# Patient Record
Sex: Female | Born: 1970 | Race: White | Hispanic: No | Marital: Married | Smoking: Former smoker
Health system: Southern US, Community
[De-identification: ages and names within clinical notes are randomized; demographics above are authoritative.]

## PROBLEM LIST (undated history)

## (undated) DIAGNOSIS — M542 Cervicalgia: Secondary | ICD-10-CM

## (undated) DIAGNOSIS — R51 Headache: Secondary | ICD-10-CM

## (undated) DIAGNOSIS — R87629 Unspecified abnormal cytological findings in specimens from vagina: Secondary | ICD-10-CM

## (undated) DIAGNOSIS — M5030 Other cervical disc degeneration, unspecified cervical region: Secondary | ICD-10-CM

## (undated) DIAGNOSIS — G8929 Other chronic pain: Secondary | ICD-10-CM

## (undated) DIAGNOSIS — M25511 Pain in right shoulder: Secondary | ICD-10-CM

## (undated) DIAGNOSIS — R519 Headache, unspecified: Secondary | ICD-10-CM

## (undated) HISTORY — DX: Unspecified abnormal cytological findings in specimens from vagina: R87.629

## (undated) HISTORY — PX: BACK SURGERY: SHX140

## (undated) HISTORY — PX: OTHER SURGICAL HISTORY: SHX169

## (undated) HISTORY — PX: GYNECOLOGIC CRYOSURGERY: SHX857

---

## 2002-01-06 ENCOUNTER — Encounter: Payer: Self-pay | Admitting: Internal Medicine

## 2002-01-06 ENCOUNTER — Emergency Department (HOSPITAL_COMMUNITY): Admission: EM | Admit: 2002-01-06 | Discharge: 2002-01-06 | Payer: Self-pay | Admitting: Internal Medicine

## 2003-02-02 ENCOUNTER — Emergency Department (HOSPITAL_COMMUNITY): Admission: EM | Admit: 2003-02-02 | Discharge: 2003-02-02 | Payer: Self-pay | Admitting: Emergency Medicine

## 2008-07-15 ENCOUNTER — Encounter: Payer: Self-pay | Admitting: Orthopedic Surgery

## 2008-07-30 ENCOUNTER — Encounter: Payer: Self-pay | Admitting: Orthopedic Surgery

## 2009-06-09 ENCOUNTER — Encounter: Payer: Self-pay | Admitting: Maternal & Fetal Medicine

## 2009-07-14 ENCOUNTER — Encounter: Payer: Self-pay | Admitting: Obstetrics and Gynecology

## 2010-09-24 ENCOUNTER — Encounter: Payer: Self-pay | Admitting: Orthopaedic Surgery

## 2010-09-29 ENCOUNTER — Encounter: Payer: Self-pay | Admitting: Orthopaedic Surgery

## 2011-01-26 ENCOUNTER — Other Ambulatory Visit (HOSPITAL_COMMUNITY): Payer: Self-pay | Admitting: Orthopaedic Surgery

## 2011-01-26 DIAGNOSIS — M542 Cervicalgia: Secondary | ICD-10-CM

## 2011-01-26 DIAGNOSIS — M25519 Pain in unspecified shoulder: Secondary | ICD-10-CM

## 2011-01-28 ENCOUNTER — Ambulatory Visit (HOSPITAL_COMMUNITY)
Admission: RE | Admit: 2011-01-28 | Discharge: 2011-01-28 | Disposition: A | Discharge status: Home / Self Care | Payer: Medicaid Other | Source: Ambulatory Visit | Attending: Orthopaedic Surgery | Admitting: Orthopaedic Surgery

## 2011-01-28 DIAGNOSIS — R209 Unspecified disturbances of skin sensation: Secondary | ICD-10-CM | POA: Insufficient documentation

## 2011-01-28 DIAGNOSIS — M5020 Other cervical disc displacement, unspecified cervical region: Secondary | ICD-10-CM | POA: Insufficient documentation

## 2011-01-28 DIAGNOSIS — M25519 Pain in unspecified shoulder: Secondary | ICD-10-CM | POA: Insufficient documentation

## 2011-01-28 DIAGNOSIS — M5380 Other specified dorsopathies, site unspecified: Secondary | ICD-10-CM | POA: Insufficient documentation

## 2011-01-28 DIAGNOSIS — M542 Cervicalgia: Secondary | ICD-10-CM | POA: Insufficient documentation

## 2012-02-17 ENCOUNTER — Encounter (HOSPITAL_COMMUNITY): Payer: Self-pay | Admitting: *Deleted

## 2012-02-17 ENCOUNTER — Emergency Department (HOSPITAL_COMMUNITY)
Admission: EM | Admit: 2012-02-17 | Discharge: 2012-02-17 | Disposition: A | Discharge status: Home / Self Care | Payer: Medicaid Other | Attending: Emergency Medicine | Admitting: Emergency Medicine

## 2012-02-17 DIAGNOSIS — J329 Chronic sinusitis, unspecified: Secondary | ICD-10-CM | POA: Insufficient documentation

## 2012-02-17 DIAGNOSIS — F17200 Nicotine dependence, unspecified, uncomplicated: Secondary | ICD-10-CM | POA: Insufficient documentation

## 2012-02-17 MED ORDER — AZITHROMYCIN 250 MG PO TABS: ORAL_TABLET | ORAL | Status: AC

## 2012-02-17 MED ORDER — HYDROCODONE-ACETAMINOPHEN 5-325 MG PO TABS: 2.0000 | ORAL_TABLET | ORAL | Status: AC | PRN

## 2012-02-17 MED ORDER — ACETAMINOPHEN 325 MG PO TABS
650.0000 mg | ORAL_TABLET | Freq: Once | ORAL | Status: AC
  Administered 2012-02-17: 650 mg via ORAL
  Filled 2012-02-17: qty 2

## 2012-02-17 MED ORDER — BENZONATATE 100 MG PO CAPS
200.0000 mg | ORAL_CAPSULE | Freq: Once | ORAL | Status: AC
  Administered 2012-02-17: 200 mg via ORAL
  Filled 2012-02-17: qty 2

## 2012-02-17 NOTE — ED Notes (Signed)
Pt c/o nasal congestion x 2 weeks. Pt also c/o coughing up green phlegm and headache x 2 days.

## 2012-02-17 NOTE — Discharge Instructions (Signed)
 Sinusitis Sinuses are air pockets within the bones of your face. The growth of bacteria within a sinus leads to infection. The infection prevents the sinuses from draining. This infection is called sinusitis. SYMPTOMS  There will be different areas of pain depending on which sinuses have become infected.  The maxillary sinuses often produce pain beneath the eyes.   Frontal sinusitis may cause pain in the middle of the forehead and above the eyes.  Other problems (symptoms) include:  Toothaches.   Colored, pus-like (purulent) drainage from the nose.   Swelling, warmth, and tenderness over the sinus areas may be signs of infection.  TREATMENT  Sinusitis is most often determined by an exam.X-rays may be taken. If x-rays have been taken, make sure you obtain your results or find out how you are to obtain them. Your caregiver may give you medications (antibiotics). These are medications that will help kill the bacteria causing the infection. You may also be given a medication (decongestant) that helps to reduce sinus swelling.  HOME CARE INSTRUCTIONS   Only take over-the-counter or prescription medicines for pain, discomfort, or fever as directed by your caregiver.   Drink extra fluids. Fluids help thin the mucus so your sinuses can drain more easily.   Applying either moist heat or ice packs to the sinus areas may help relieve discomfort.   Use saline nasal sprays to help moisten your sinuses. The sprays can be found at your local drugstore.  SEEK IMMEDIATE MEDICAL CARE IF:  You have a fever.   You have increasing pain, severe headaches, or toothache.   You have nausea, vomiting, or drowsiness.   You develop unusual swelling around the face or trouble seeing.  MAKE SURE YOU:   Understand these instructions.   Will watch your condition.   Will get help right away if you are not doing well or get worse.  Document Released: 11/15/2005 Document Revised: 11/04/2011 Document Reviewed:  06/14/2007 [facility] Patient Information 2012 [place], [place].

## 2012-02-17 NOTE — ED Notes (Signed)
Pt senn and evaluated by EDP for initial assessment.

## 2012-02-17 NOTE — ED Provider Notes (Signed)
History   This chart was scribed for Kelly Baker, MD scribed by Magnus Sinning. The patient was seen in room APA11/APA11 seen at 15:50.   CSN: [id number]  Arrival date & time 02/17/12  1522   First MD Initiated Contact with Patient 02/17/12 1547      Chief Complaint  Patient presents with  . Nasal Congestion    (Consider location/radiation/quality/duration/timing/severity/associated sxs/prior treatment) HPI Kelly Nicholson is a 41 y.o. female who presents to the Emergency Department complaining of constant moderate nasal and head congestion onset 2 weeks with associated runny nose, and cough onset two days. Denies fever,n/v/d, or urinary sx.  Reports taking tylenol cold and sinus, and benedryl w/o improvement. Pt is a current smoker, but denies any medical conditions or problems.  History reviewed. No pertinent past medical history.  Past Surgical History  Procedure Date  . Back surgery     History reviewed. No pertinent family history.  History  Substance Use Topics  . Smoking status: Current Everyday Smoker -- 0.5 packs/day  . Smokeless tobacco: Not on file  . Alcohol Use: No   Review of Systems  HENT: Positive for congestion and sinus pressure.   Respiratory: Positive for cough.   Gastrointestinal: Negative for nausea, vomiting and diarrhea.  Genitourinary: Negative.   All other systems reviewed and are negative.   10 Systems reviewed and are negative for acute change except as noted in the HPI. Allergies  Amoxicillin  Home Medications  No current outpatient prescriptions on file.  BP 96/41  Pulse 68  Temp(Src) 98 F (36.7 C) (Oral)  Resp 18  Ht 5\' 7"  (1.702 m)  Wt 160 lb (72.576 kg)  BMI 25.06 kg/m2  SpO2 100%  LMP 02/14/2012  Physical Exam  Nursing note and vitals reviewed. Constitutional: She is oriented to person, place, and time. She appears well-developed and well-nourished. No distress.  HENT:  Head: Normocephalic and atraumatic.  Right  Ear: External ear normal.  Left Ear: External ear normal.  Mouth/Throat: Oropharynx is clear and moist. No oropharyngeal exudate.       Maxillary sinus pain  Eyes: EOM are normal. Pupils are equal, round, and reactive to light.  Neck: Normal range of motion. Neck supple. No tracheal deviation present.  Cardiovascular: Normal rate.   Pulmonary/Chest: Effort normal and breath sounds normal. No respiratory distress. She has no wheezes. She has no rales.  Abdominal: Soft. She exhibits no distension.  Musculoskeletal: Normal range of motion. She exhibits no edema.  Lymphadenopathy:    She has no cervical adenopathy.  Neurological: She is alert and oriented to person, place, and time. No sensory deficit.  Skin: Skin is warm and dry.  Psychiatric: She has a normal mood and affect. Her behavior is normal.    ED Course  Procedures (including critical care time) DIAGNOSTIC STUDIES: Oxygen Saturation is 100% on room air, normal by my interpretation.    COORDINATION OF CARE:  Labs Reviewed - No data to display No results found.   No diagnosis found.    MDM  Pt to be tx for sinusitis I personally performed the services described in this documentation, which was scribed in my presence. The recorded information has been reviewed and considered.          Kelly Baker, MD 02/17/12 [phone]

## 2012-08-16 ENCOUNTER — Emergency Department (HOSPITAL_COMMUNITY)
Admission: EM | Admit: 2012-08-16 | Discharge: 2012-08-16 | Disposition: A | Discharge status: Home / Self Care | Payer: Medicaid Other | Attending: Emergency Medicine | Admitting: Emergency Medicine

## 2012-08-16 ENCOUNTER — Encounter (HOSPITAL_COMMUNITY): Payer: Self-pay | Admitting: Emergency Medicine

## 2012-08-16 DIAGNOSIS — F17200 Nicotine dependence, unspecified, uncomplicated: Secondary | ICD-10-CM | POA: Insufficient documentation

## 2012-08-16 DIAGNOSIS — Z880 Allergy status to penicillin: Secondary | ICD-10-CM | POA: Insufficient documentation

## 2012-08-16 DIAGNOSIS — R51 Headache: Secondary | ICD-10-CM | POA: Insufficient documentation

## 2012-08-16 MED ORDER — MORPHINE SULFATE 4 MG/ML IJ SOLN
8.0000 mg | Freq: Once | INTRAMUSCULAR | Status: AC
  Administered 2012-08-16: 8 mg via INTRAMUSCULAR
  Filled 2012-08-16: qty 2

## 2012-08-16 MED ORDER — PROMETHAZINE HCL 12.5 MG PO TABS
25.0000 mg | ORAL_TABLET | Freq: Once | ORAL | Status: AC
  Administered 2012-08-16: 25 mg via ORAL
  Filled 2012-08-16 (×2): qty 1

## 2012-08-16 MED ORDER — HYDROCODONE-ACETAMINOPHEN 7.5-325 MG PO TABS: 1.0000 | ORAL_TABLET | ORAL | Status: AC | PRN

## 2012-08-16 NOTE — ED Notes (Signed)
Pt presents with migraine headache x 3 days. Sound and light sensitivity has increased over past 2 days.  Pt states has headaches almost monthly. Denies n/v, fever and vision changes. Pt appears comfortable watching TV with light off. NAD noted.

## 2012-08-16 NOTE — ED Provider Notes (Signed)
History     CSN: [id number]  Arrival date & time 08/16/12  1028   First MD Initiated Contact with Patient 08/16/12 1357      Chief Complaint  Patient presents with  . Headache    (Consider location/radiation/quality/duration/timing/severity/associated sxs/prior treatment) Patient is a 41 y.o. female presenting with headaches. The history is provided by the patient.  Headache  This is a recurrent problem. The current episode started 2 days ago. The problem has been gradually worsening. Associated with: cervical disc disease and stress. The pain is located in the right unilateral region. The quality of the pain is described as throbbing. The pain is moderate. Associated symptoms include nausea. Pertinent negatives include no fever, no palpitations and no shortness of breath. She has tried acetaminophen for the symptoms. The treatment provided no relief.    History reviewed. No pertinent past medical history.  Past Surgical History  Procedure Date  . Back surgery     No family history on file.  History  Substance Use Topics  . Smoking status: Current Every Day Smoker -- 0.5 packs/day  . Smokeless tobacco: Not on file  . Alcohol Use: No    OB History    Grav Para Term Preterm Abortions TAB SAB Ect Mult Living                  Review of Systems  Constitutional: Negative for fever and activity change.       All ROS Neg except as noted in HPI  HENT: Negative for nosebleeds and neck pain.   Eyes: Negative for photophobia and discharge.  Respiratory: Negative for cough, shortness of breath and wheezing.   Cardiovascular: Negative for chest pain and palpitations.  Gastrointestinal: Positive for nausea. Negative for abdominal pain and blood in stool.  Genitourinary: Negative for dysuria, frequency and hematuria.  Musculoskeletal: Positive for back pain. Negative for arthralgias.  Skin: Negative.   Neurological: Positive for headaches. Negative for dizziness, seizures and  speech difficulty.  Psychiatric/Behavioral: Negative for hallucinations and confusion.    Allergies  Amoxicillin  Home Medications   Current Outpatient Rx  Name Route Sig Dispense Refill  . ACETAMINOPHEN 500 MG PO TABS Oral Take 1,000 mg by mouth every 8 (eight) hours as needed. Pain    . IBUPROFEN 800 MG PO TABS Oral Take 800 mg by mouth every 8 (eight) hours as needed. Pain      BP 111/63  Pulse 60  Temp 98.4 F (36.9 C) (Oral)  Resp 18  Ht 5\' 7"  (1.702 m)  Wt 160 lb (72.576 kg)  BMI 25.06 kg/m2  SpO2 100%  LMP 08/10/2012  Physical Exam  Nursing note and vitals reviewed. Constitutional: She is oriented to person, place, and time. She appears well-developed and well-nourished.  Non-toxic appearance.  HENT:  Head: Normocephalic.  Right Ear: Tympanic membrane and external ear normal.  Left Ear: Tympanic membrane and external ear normal.  Eyes: EOM and lids are normal. Pupils are equal, round, and reactive to light.  Neck: Normal range of motion. Neck supple. Carotid bruit is not present.  Cardiovascular: Normal rate, regular rhythm, normal heart sounds, intact distal pulses and normal pulses.   Pulmonary/Chest: Breath sounds normal. No respiratory distress.  Abdominal: Soft. Bowel sounds are normal. There is no tenderness. There is no guarding.  Musculoskeletal: Normal range of motion.  Lymphadenopathy:       Head (right side): No submandibular adenopathy present.       Head (left side): No submandibular  adenopathy present.    She has no cervical adenopathy.  Neurological: She is alert and oriented to person, place, and time. She has normal strength. No cranial nerve deficit or sensory deficit. She exhibits normal muscle tone. Coordination normal.  Skin: Skin is warm and dry.  Psychiatric: She has a normal mood and affect. Her speech is normal.    ED Course  Procedures (including critical care time)  Labs Reviewed - No data to display No results found.  PUlse ox  100% on room air. WNL by my interpretation. No diagnosis found.    MDM    patient has history of headaches. Mostly the headache seemed to be related to a cervical disc problem. The patient had previously been seen by neurosurgeon, but is no longer seeing the neurosurgeon at this time. The patient is treated with Norco 7.5 mg one every 4 hours as needed for.       Kathie Dike, [place] 08/16/12 [phone]

## 2012-08-16 NOTE — ED Provider Notes (Signed)
Medical screening examination/treatment/procedure(s) were performed by non-physician practitioner and as supervising physician I was immediately available for consultation/collaboration.   Garnie Borchardt L Curlie Sittner, MD 08/16/12 1540 

## 2012-08-16 NOTE — ED Notes (Signed)
Patient with c/o headache x 2 days. Denies nausea/vomiting. Reports no relief with heat, ice, motrin/tylenol.

## 2014-01-18 ENCOUNTER — Encounter (HOSPITAL_COMMUNITY): Payer: Self-pay | Admitting: Emergency Medicine

## 2014-01-18 ENCOUNTER — Emergency Department (HOSPITAL_COMMUNITY): Payer: Medicaid Other

## 2014-01-18 ENCOUNTER — Emergency Department (HOSPITAL_COMMUNITY)
Admission: EM | Admit: 2014-01-18 | Discharge: 2014-01-18 | Disposition: A | Discharge status: Home / Self Care | Payer: Medicaid Other | Attending: Emergency Medicine | Admitting: Emergency Medicine

## 2014-01-18 DIAGNOSIS — R519 Headache, unspecified: Secondary | ICD-10-CM

## 2014-01-18 DIAGNOSIS — Z8739 Personal history of other diseases of the musculoskeletal system and connective tissue: Secondary | ICD-10-CM | POA: Insufficient documentation

## 2014-01-18 DIAGNOSIS — Z880 Allergy status to penicillin: Secondary | ICD-10-CM | POA: Insufficient documentation

## 2014-01-18 DIAGNOSIS — J159 Unspecified bacterial pneumonia: Secondary | ICD-10-CM | POA: Insufficient documentation

## 2014-01-18 DIAGNOSIS — R51 Headache: Secondary | ICD-10-CM | POA: Insufficient documentation

## 2014-01-18 DIAGNOSIS — J189 Pneumonia, unspecified organism: Secondary | ICD-10-CM

## 2014-01-18 DIAGNOSIS — F17200 Nicotine dependence, unspecified, uncomplicated: Secondary | ICD-10-CM | POA: Insufficient documentation

## 2014-01-18 DIAGNOSIS — G8929 Other chronic pain: Secondary | ICD-10-CM | POA: Insufficient documentation

## 2014-01-18 DIAGNOSIS — Z3202 Encounter for pregnancy test, result negative: Secondary | ICD-10-CM | POA: Insufficient documentation

## 2014-01-18 HISTORY — DX: Headache, unspecified: R51.9

## 2014-01-18 HISTORY — DX: Other chronic pain: G89.29

## 2014-01-18 HISTORY — DX: Headache: R51

## 2014-01-18 HISTORY — DX: Cervicalgia: M54.2

## 2014-01-18 HISTORY — DX: Other cervical disc degeneration, unspecified cervical region: M50.30

## 2014-01-18 HISTORY — DX: Pain in right shoulder: M25.511

## 2014-01-18 LAB — URINE MICROSCOPIC-ADD ON

## 2014-01-18 LAB — URINALYSIS, ROUTINE W REFLEX MICROSCOPIC
Bilirubin Urine: NEGATIVE
Glucose, UA: NEGATIVE mg/dL
Ketones, ur: NEGATIVE mg/dL
LEUKOCYTES UA: NEGATIVE
NITRITE: NEGATIVE
PH: 6 (ref 5.0–8.0)
Protein, ur: NEGATIVE mg/dL
UROBILINOGEN UA: 0.2 mg/dL (ref 0.0–1.0)

## 2014-01-18 LAB — PREGNANCY, URINE: PREG TEST UR: NEGATIVE

## 2014-01-18 LAB — I-STAT CHEM 8, ED
BUN: 8 mg/dL (ref 6–23)
CALCIUM ION: 1.04 mmol/L — AB (ref 1.12–1.23)
CHLORIDE: 107 meq/L (ref 96–112)
Creatinine, Ser: 0.7 mg/dL (ref 0.50–1.10)
Glucose, Bld: 81 mg/dL (ref 70–99)
HEMATOCRIT: 41 % (ref 36.0–46.0)
Hemoglobin: 13.9 g/dL (ref 12.0–15.0)
POTASSIUM: 4.4 meq/L (ref 3.7–5.3)
SODIUM: 138 meq/L (ref 137–147)
TCO2: 23 mmol/L (ref 0–100)

## 2014-01-18 MED ORDER — SODIUM CHLORIDE 0.9 % IV SOLN
INTRAVENOUS | Status: DC
  Administered 2014-01-18: 100 mL/h via INTRAVENOUS

## 2014-01-18 MED ORDER — KETOROLAC TROMETHAMINE 60 MG/2ML IM SOLN
60.0000 mg | Freq: Once | INTRAMUSCULAR | Status: AC
  Administered 2014-01-18: 60 mg via INTRAMUSCULAR
  Filled 2014-01-18: qty 2

## 2014-01-18 MED ORDER — DIPHENHYDRAMINE HCL 50 MG/ML IJ SOLN
50.0000 mg | Freq: Once | INTRAMUSCULAR | Status: AC
  Administered 2014-01-18: 50 mg via INTRAMUSCULAR
  Filled 2014-01-18: qty 1

## 2014-01-18 MED ORDER — IOHEXOL 350 MG/ML SOLN
100.0000 mL | Freq: Once | INTRAVENOUS | Status: AC | PRN
  Administered 2014-01-18: 100 mL via INTRAVENOUS

## 2014-01-18 MED ORDER — PROMETHAZINE HCL 25 MG/ML IJ SOLN
25.0000 mg | Freq: Once | INTRAMUSCULAR | Status: AC
  Administered 2014-01-18: 25 mg via INTRAMUSCULAR
  Filled 2014-01-18: qty 1

## 2014-01-18 MED ORDER — PROMETHAZINE HCL 25 MG PO TABS: 25.0000 mg | ORAL_TABLET | Freq: Four times a day (QID) | ORAL | Status: DC | PRN

## 2014-01-18 MED ORDER — AZITHROMYCIN 250 MG PO TABS: 250.0000 mg | ORAL_TABLET | Freq: Every day | ORAL | Status: DC

## 2014-01-18 MED ORDER — AZITHROMYCIN 250 MG PO TABS
500.0000 mg | ORAL_TABLET | Freq: Once | ORAL | Status: AC
  Administered 2014-01-18: 500 mg via ORAL
  Filled 2014-01-18: qty 2

## 2014-01-18 NOTE — ED Provider Notes (Signed)
CSN: [id number]     Arrival date & time 01/18/14  1248 History   First MD Initiated Contact with Patient 01/18/14 1340     Chief Complaint  Patient presents with  . Headache     HPI Pt was seen at 1350. Per pt, c/o gradual onset and persistence of constant acute flair of her chronic migraine headache for the past 2 days.  Describes the headache as per her usual chronic migraine headache pain pattern for many years. Endorses chronic recurrent monthly headaches "around when I get my period."  Denies headache was sudden or maximal in onset or at any time.  Denies visual changes, no focal motor weakness, no tingling/numbness in extremities, no fevers, no neck pain, no rash.  Pt also c/o gradual onset and persistence of constant right proximal posterior-lateral ribs "pain" for the past 5 days. Pt states the pain began after she did "a lot of pulling of heavy boxes" at work. Pain worsens with palpation of the area and "pulling" movement of her RUE. Denies direct injury, no SOB/cough, no rash.     Past Medical History  Diagnosis Date  . Chronic headaches   . Chronic neck pain     extending into right shoulder  . DDD (degenerative disc disease), cervical   . Chronic right shoulder pain    Past Surgical History  Procedure Laterality Date  . Back surgery      History  Substance Use Topics  . Smoking status: Current Every Day Smoker -- 0.50 packs/day  . Smokeless tobacco: Not on file  . Alcohol Use: No    Review of Systems ROS: Statement: All systems negative except as marked or noted in the HPI; Constitutional: Negative for fever and chills. ; ; Eyes: Negative for eye pain, redness and discharge. ; ; ENMT: Negative for ear pain, hoarseness, nasal congestion, sinus pressure and sore throat. ; ; Cardiovascular: Negative for chest pain, palpitations, diaphoresis, dyspnea and peripheral edema. ; ; Respiratory: Negative for cough, wheezing and stridor. ; ; Gastrointestinal: Negative for nausea,  vomiting, diarrhea, abdominal pain, blood in stool, hematemesis, jaundice and rectal bleeding. . ; ; Genitourinary: Negative for dysuria, flank pain and hematuria. ; ; Musculoskeletal: +right proximal posterior-lateral ribs pain. Negative for back pain and neck pain. Negative for swelling and trauma.; ; Skin: Negative for pruritus, rash, abrasions, blisters, bruising and skin lesion.; ; Neuro: +headache. Negative for lightheadedness and neck stiffness. Negative for weakness, altered level of consciousness , altered mental status, extremity weakness, paresthesias, involuntary movement, seizure and syncope.      Allergies  Amoxicillin and Tramadol  Home Medications   Current Outpatient Rx  Name  Route  Sig  Dispense  Refill  . acetaminophen (TYLENOL) 500 MG tablet   Oral   Take 1,000 mg by mouth every 8 (eight) hours as needed. Pain         . ibuprofen (ADVIL,MOTRIN) 800 MG tablet   Oral   Take 800 mg by mouth every 8 (eight) hours as needed. Pain          BP 117/59  Pulse 82  Temp(Src) 98.6 F (37 C) (Oral)  Resp 19  Ht 5\' 7"  (1.702 m)  Wt 161 lb (73.029 kg)  BMI 25.21 kg/m2  SpO2 99%  LMP 01/18/2014 Physical Exam 1355: Physical examination:  Nursing notes reviewed; Vital signs and O2 SAT reviewed;  Constitutional: Well developed, Well nourished, Well hydrated, In no acute distress; Head:  Normocephalic, atraumatic; Eyes: EOMI, PERRL, No scleral  icterus; ENMT: TM's clear bilat. +edemetous nasal turbinates bilat with clear rhinorrhea. Mouth and pharynx normal, Mucous membranes moist; Neck: Supple, Full range of motion, No lymphadenopathy; Cardiovascular: Regular rate and rhythm, No murmur, rub, or gallop; Respiratory: Breath sounds clear & equal bilaterally, No rales, rhonchi, wheezes.  Speaking full sentences with ease, Normal respiratory effort/excursion; Chest: Nontender, Movement normal; Abdomen: Soft, Nontender, Nondistended, Normal bowel sounds; Genitourinary: No CVA  tenderness; Spine:  No midline CS, TS, LS tenderness. +TTP right latissimus dorsi, esp with RUE "pulling movement." No rash, no soft tissue crepitus, no deformity.;; Extremities: Pulses normal, No tenderness, No edema, No calf edema or asymmetry.; Neuro: AA&Ox3, Major CN grossly intact. No facial droop. Speech clear. Grips equal, strength 5/5 equal bilat UE's and LE's. Climbs on and off stretcher easily by herself. Gait steady. No gross focal motor or sensory deficits in extremities.; Skin: Color normal, Warm, Dry.   ED Course  Procedures     EKG Interpretation   None       MDM  MDM Reviewed: previous chart, nursing note and vitals   Results for orders placed during the hospital encounter of 01/18/14  PREGNANCY, URINE      Result Value Ref Range   Preg Test, Ur NEGATIVE  NEGATIVE  URINALYSIS, ROUTINE W REFLEX MICROSCOPIC      Result Value Ref Range   Color, Urine YELLOW  YELLOW   APPearance CLEAR  CLEAR   Specific Gravity, Urine <1.005 (*) 1.005 - 1.030   pH 6.0  5.0 - 8.0   Glucose, UA NEGATIVE  NEGATIVE mg/dL   Hgb urine dipstick LARGE (*) NEGATIVE   Bilirubin Urine NEGATIVE  NEGATIVE   Ketones, ur NEGATIVE  NEGATIVE mg/dL   Protein, ur NEGATIVE  NEGATIVE mg/dL   Urobilinogen, UA 0.2  0.0 - 1.0 mg/dL   Nitrite NEGATIVE  NEGATIVE   Leukocytes, UA NEGATIVE  NEGATIVE  URINE MICROSCOPIC-ADD ON      Result Value Ref Range   Squamous Epithelial / LPF FEW (*) RARE   WBC, UA 0-2  <3 WBC/hpf   RBC / HPF 3-6  <3 RBC/hpf   Bacteria, UA RARE  RARE  I-STAT CHEM 8, ED      Result Value Ref Range   Sodium 138  137 - 147 mEq/L   Potassium 4.4  3.7 - 5.3 mEq/L   Chloride 107  96 - 112 mEq/L   BUN 8  6 - 23 mg/dL   Creatinine, Ser 1.61  0.50 - 1.10 mg/dL   Glucose, Bld 81  70 - 99 mg/dL   Calcium, Ion 0.96 (*) 1.12 - 1.23 mmol/L   TCO2 23  0 - 100 mmol/L   Hemoglobin 13.9  12.0 - 15.0 g/dL   HCT 04.5  40.9 - 81.1 %   Dg Ribs Unilateral W/chest Right 01/18/2014   CLINICAL DATA:   Right-sided chest pain  EXAM: RIGHT RIBS AND CHEST - 3+ VIEW  COMPARISON:  None.  FINDINGS: Cardiac shadow is within normal limits. The lungs are well aerated bilaterally. Ribs are well visualized without evidence of acute rib fracture. No pneumothorax is noted. There is a density identified in the lateral pleural margin between the anterior aspects of the second and third ribs on the right. It is not as well appreciated on the oblique imaging.  IMPRESSION: No acute rib fracture is noted. There is a soft tissue density noted along the lateral pleural margin as described. Further evaluation is recommended.   Electronically Signed  By: Alcide Clever M.D.   On: 01/18/2014 14:41   Ct Angio Chest Pe W/cm &/or Wo Cm 01/18/2014   CLINICAL DATA:  Right chest and back pain  EXAM: CT CHEST WITH CONTRAST  TECHNIQUE: Multidetector CT imaging of the chest was performed during intravenous contrast administration.  CONTRAST:  OMNIPAQUE IOHEXOL 350 MG/ML SOLN  COMPARISON:  01/18/2014  FINDINGS: Pulmonary arteries appear patent and well visualized. No significant filling defect or pulmonary embolus demonstrated by CTA. Intact thoracic aorta. Negative for aneurysm or dissection. No mediastinal hemorrhage or hematoma. Normal heart size. No pericardial effusion. Mildly prominent right hilar lymph node measures 15 x 13 mm, image 37.  Small dependent non loculated right pleural effusion noted.  Lung windows demonstrate a peripheral subpleural right upper lobe focal airspace opacity, image 37 of the lung windows suspicious for mild right upper lobe focal pneumonia. Additionally, patchy ground-glass opacities noted in both lower lobes, more pronounced in the right lower lobe could represent a nonspecific mild alveolitis as well. Lingula, inferior right middle lobe, and basilar atelectasis also evident.  Lung apices demonstrate scattered subpleural blebs. No pneumothorax. Small peripheral noncalcified but well-circumscribed  pulmonary nodules are noted in the right lung. Specifically, there is a right lower lobe peripheral 5 mm pulmonary nodule, image 51 there is an inferior subpleural 4 mm nodule in the right middle lobe anteriorly, image 59. there is a second right lower lobe pulmonary nodule in the superior segment measuring 5 mm, image 41. Left lung does not demonstrate any significant pulmonary nodules.  Included upper abdomen demonstrates no acute or additional finding.  No acute osseous finding.  IMPRESSION: Negative for significant acute pulmonary embolus by CTA.  Small round peripheral right upper lobe airspace opacity correlates with the chest x-ray finding compatible with a focal pneumonia. Small associated non loculated right pleural effusion.  Bilateral lower lobe patchy ground-glass opacity compatible with nonspecific alveolitis  Bibasilar atelectasis  Subcentimeter right upper lobe and right lower lobe pulmonary nodules (3).  If the patient is at high risk for bronchogenic carcinoma, follow-up chest CT at 6-12 months is recommended. If the patient is at low risk for bronchogenic carcinoma, follow-up chest CT at 12 months is recommended. This recommendation follows the consensus statement: Guidelines for Management of Small Pulmonary Nodules Detected on CT Scans: A Statement from the Fleischner Society as published in Radiology 2005;237:395-400.   Electronically Signed   By: Ruel Favors M.D.   On: 01/18/2014 19:15    1950:  Will tx for CAP. Pt has been ambulatory with steady/upright gait, easy resps, NAD. Sats remain 99% R/A. Pt feels improved after meds and wants to go home now.  Dx and testing d/w pt and family.  Questions answered.  Verb understanding, agreeable to d/c home with outpt f/u.    Laray Anger, DO 01/22/14 1233

## 2014-01-18 NOTE — ED Notes (Signed)
Pt continues to ask for food and drink, advise pt and family pending test results

## 2014-01-18 NOTE — Discharge Instructions (Signed)
°Emergency Department Resource Guide °1) Find a Doctor and Pay Out of Pocket °Although you won't have to find out who is covered by your insurance plan, it is a good idea to ask around and get recommendations. You will then need to call the office and see if the doctor you have chosen will accept you as a new patient and what types of options they offer for patients who are self-pay. Some doctors offer discounts or will set up payment plans for their patients who do not have insurance, but you will need to ask so you aren't surprised when you get to your appointment. ° °2) Contact Your Local Health Department °Not all health departments have doctors that can see patients for sick visits, but many do, so it is worth a call to see if yours does. If you don't know where your local health department is, you can check in your phone book. The CDC also has a tool to help you locate your state's health department, and many state websites also have listings of all of their local health departments. ° °3) Find a Walk-in Clinic °If your illness is not likely to be very severe or complicated, you may want to try a walk in clinic. These are popping up all over the country in pharmacies, drugstores, and shopping centers. They're usually staffed by nurse practitioners or physician assistants that have been trained to treat common illnesses and complaints. They're usually fairly quick and inexpensive. However, if you have serious medical issues or chronic medical problems, these are probably not your best option. ° °No Primary Care Doctor: °- Call [facility] Connect at  832-8000 - they can help you locate a primary care doctor that  accepts your insurance, provides certain services, etc. °- Physician Referral Service- 1-800-533-3463 ° °Chronic Pain Problems: °Organization         Address  Phone   Notes  °[facility]  ([phone]) [phone] Patients need to be referred by their primary care doctor.  ° °Medication  Assistance: °Organization         Address  Phone   Notes  °[place] [facility] [address] °[place], [place] [address] (336) 641-8030 --Must be a resident of [place] °-- Must have NO insurance coverage whatsoever (no Medicaid/ Medicare, etc.) °-- The pt. MUST have a primary care doctor that directs their care regularly and follows them in the community °  °[facility]  (866) 331-1348   °[employer]  (888) 892-1162   ° °Agencies that provide inexpensive medical care: °Organization         Address  Phone   Notes  °[facility]  (336) 832-8035   ° Internal Medicine    (336) 832-7272   °[facility] [address] °[place], [place] [address] (336) 832-4777   °[facility] [address], °[place] (336) 271-4999   °[facility]    (336) 373-0678   °[facility]    (336) 272-1050   °[facility] ° [address], [place] Phone:  (336) 832-4444, Fax:  (336) 832-4440 Hours of Operation:  9 am - 6 pm, M-F.  Also accepts Medicaid/Medicare and self-pay.  °[facility] ° [address],  Phone: (336) 832-3150, Fax: (336) [phone]. Hours of Operation:  8:30 am - 5:30 pm, M-F.  Also accepts Medicaid and self-pay.  °[facility] [place] [address], [place] Phone: (336) 878-6027   °[facility] [address], [place], [place] (336)723-1848, Ext. 123 Mondays & Thursdays: 7-9 AM.  First 15 patients are seen on a first come, first serve basis. °  ° °Medicaid-accepting [place] Providers: ° °Organization         Address  Phone   Notes  °[facility] [address], [place] (336) 641-2100 Also accepts self-pay patients.  °[facility] [address], [place] ° (336) 856-9996   °[facility] [address], [place]  (336) 288-8857   °[facility] [address] [place] [address], [place] (336) 299-7000   °Veita Bland [address], [place]  ° (336) 373-1557 Only accepts [place] Access Medicaid patients after they have their name applied to their card.  ° °Self-Pay (no insurance) in [place]: ° °Organization         Address  Phone   Notes  °Sickle Cell Patients, [facility] [address], [place] (336) 832-1970   °[facility] [address], [place] (336) 832-4400   °[facility] ° [address], [place] (336) 992-4800   °[facility]/Dr. Osei-Bonsu ° [address] [place] [address], [place] or [address], [place] (336) [phone] Phone number for both [place] and [place] locations is the same.  °[facility] [address], [place] (336) 299-0000   °[facility] [place] [address] [place] [address], [place] or [address] (336) 852-7530 °(336) 878-2260   °[facility] [address], [place] (336) 350-1642, phone; (336) 294-5005, fax Sees patients 1st and 3rd Saturday of every month.  Must not qualify for public or private insurance (i.e. Medicaid, Medicare, [place] Health Choice, Veterans' Benefits) • Household income should be no more than 200% of the poverty level •The clinic cannot treat you if you are pregnant or think you are pregnant • Sexually transmitted diseases are not treated at the clinic.  ° ° °Dental Care: °Organization         Address  Phone  Notes  °[place] [facility] [address], [place] (336) 641-6152 Accepts children up to age 21 who are enrolled in Medicaid or [place] Health Choice; pregnant women with a Medicaid card; and children who have applied for Medicaid or [place] Health Choice, but were declined, whose parents can pay a reduced fee at time of service.  °[place]  [facility] [place]  [address], [place] (336) 641-7733 Accepts children up to age 21 who are enrolled in Medicaid or [place] Health Choice; pregnant women with a Medicaid card; and children who have applied for Medicaid or  Health Choice, but were declined, whose parents can pay a reduced fee at time of service.  °[facility] ° [address],  (336) 641-4533 Patients are seen by appointment only. Walk-ins are not accepted. [facility] will see patients 18 years of age and older. °Monday - Tuesday (8am-5pm) °Most Wednesdays (8:30-5pm) °$30 per visit, cash only  °[facility] ° [address], [place] (336) 641-4533 Patients are seen by appointment only. Walk-ins are not accepted. [facility] will see patients 18 years of age and older. °One   Wednesday Evening (Monthly: Volunteer Based).  $30 per visit, cash only  °[facility]  (919) [phone] for adults; Children under age 4, call Graduate Pediatric Dentistry at ([phone]) [phone]. Children aged 4-14, please call ([phone]) [phone] to request a pediatric application. ° Dental services are provided in all areas of dental care including fillings, crowns and bridges, complete and partial dentures, implants, gum treatment, root canals, and extractions. Preventive care is also provided. Treatment is provided to both adults and children. °Patients are selected via a lottery and there is often a waiting list. °  °[facility] [address], °[place] ° (336) 763-8833 [email or web address] °  °[facility] [address], [place], [place] (336)723-1848, Ext. 123 Second and Fourth Thursday of each month, opens at 6:30 AM; Clinic ends at 9 AM.  Patients are seen on a first-come first-served basis, and a limited number are seen during each clinic.  ° °[facility] ° [address], [place], [place] (336) 723-7904    Eligibility Requirements °You must have lived in [place], [place], or [place] counties for at least the last three months. °  You cannot be eligible for state or federal sponsored healthcare insurance, including Veterans Administration, Medicaid, or Medicare. °  You generally cannot be eligible for healthcare insurance through your employer.  °  How to apply: °Eligibility screenings are held every Tuesday and Wednesday afternoon from 1:00 pm until 4:00 pm. You do not need an appointment for the interview!  °[facility] [address], [place], [place] 336-631-2330   °[facility]  336-342-8273   °[facility]  336-703-3100   °[facility]  336-570-6415   ° °Behavioral Health Resources in the Community: °Intensive Outpatient Programs °Organization         Address  Phone  Notes  °[facility] [address], [place], [place] 336-878-6098   °[facility] [address], [place], [place] 336-832-9800   °[facility] [address], [place], [place] ° 336-882-2125   °[facility] [address],  °[place], [place] 1-800-853-5163 or 336-641-4981   °Substance Abuse Resources °Organization         Address  Phone  Notes  °[facility]  336-882-2125   °[facility]  336-784-9470   °[facility]  336-285-9073   °[facility]  336-845-3988   °Residential & Outpatient Substance Abuse Program  1-800-659-3381   °Psychological Services °Organization         Address  Phone  Notes  °[facility]  336- 832-9600   °[facility]  336- 378-7881   °[facility] [address], [place] 1-800-853-5163 or 336-641-4981   ° °Mobile Crisis Teams °Organization         Address  Phone  Notes  °[facility], Mobile Crisis Care Unit  1-877-626-1772   °Assertive °[facility] ° [address]  [place], [place] 336-834-9664   °Sharon DeEsch [address] °[place] 336-554-5454   ° °Self-Help/Support Groups °Organization         Address  Phone             Notes  °[facility]  - variety of support groups  336- 373-1402 Call for more information  °Narcotics Anonymous (NA), [facility] [address], °[place]   2 meetings at this location  ° °  Residential Treatment Programs Organization         Address  Phone  Notes  [facility] [address],    [place]  [phone]   [facility]  [address], [place] [phone], [place], [place] [phone]   [facility] [address], [place] [phone] Admissions: 8am-3pm M-F  [facility] [address]    [place], [place] [phone]) [phone]   [facility] [address], [place], [place] [phone]   [facility] [address]  [place], [place] [phone]   [facility] Intensive Outpatient [address], [place], [place] [phone]   [facility] [address]  [place], [place] [phone] or ([phone]   [facility] ([facility] [address] [place], [place] [phone] Accepts Medicaid  [facility] [address]  [place] [phone] Substance Abuse/Addiction Treatment   [facility]         Address  Phone  Notes  [facility]  (330)054-2658(888) [phone]   Angie FavaJulie Brannon, PhD [address] [place], [place]   [phone](336) [phone] or [phone](336) [phone]   [facility]   [address] [place], [place] [phone](336) ([phone]) [phone]   [facility] [address], [place], [place] [phone](336) [phone] Insurance/Medicaid/sponsorship through [facility] [address]                                    [place], [place] [phone]) [phone] Therapy/tele-psych/case    [facility] [address]   [place], [place] [phone]) [phone]    Dr. Lolly MustacheArfeen  [phone]) [phone]   [facility]  [facility] 1) [address], [place] 2) [address], [place] 3)  [address], [place] [phone]) [phone]  [phone]) [phone]   [facility] (636)240-7860(336) (207)647-9073 or [phone](336) [phone] (After Hours)       Take over the counter tylenol, ibuprofen (OR excedrin) and benadryl, as directed on packaging, with the prescription given to you today, as needed for headache.  Keep a headache diary, as discussed. Take the prescriptions as directed. Your CT scan showed an incidental finding of small pulmonary nodules: you will need a repeat CT scan in the next 6 to 12 months. Call your regular medical doctor on Monday to schedule a follow up appointment within the next 3 days to further discuss this finding.  Return to the Emergency Department immediately sooner if worsening.

## 2014-01-18 NOTE — ED Notes (Signed)
Patient ambulatory to restroom with steady gait, clean catch instructions given and advised pt to bring specimen back to room as well.  

## 2014-01-18 NOTE — ED Notes (Signed)
Pt alert & oriented x4, stable gait. Patient given discharge instructions, paperwork & prescription(s). Patient  instructed to stop at the registration desk to finish any additional paperwork. Patient verbalized understanding. Pt left department w/ no further questions. 

## 2014-01-18 NOTE — ED Notes (Signed)
PT GIVEN A COKE AND A MEAL TRAY.

## 2014-01-18 NOTE — ED Notes (Addendum)
Stabbing neck pain that gave pt a headache x 2 days. Pt states it does feel like her migraine. Pain under right arm x 5 days. Denies injury. Pain to ribs worse with movement. Eyes closed and tearful in triage. Nausea. Denies v/d. "kind of" dizzy with standing

## 2015-03-26 ENCOUNTER — Encounter (HOSPITAL_COMMUNITY): Payer: Self-pay | Admitting: Emergency Medicine

## 2015-03-26 ENCOUNTER — Emergency Department (HOSPITAL_COMMUNITY)
Admission: EM | Admit: 2015-03-26 | Discharge: 2015-03-26 | Disposition: A | Discharge status: Home / Self Care | Payer: Medicaid Other | Attending: Emergency Medicine | Admitting: Emergency Medicine

## 2015-03-26 DIAGNOSIS — G8929 Other chronic pain: Secondary | ICD-10-CM | POA: Insufficient documentation

## 2015-03-26 DIAGNOSIS — R112 Nausea with vomiting, unspecified: Secondary | ICD-10-CM | POA: Insufficient documentation

## 2015-03-26 DIAGNOSIS — Z792 Long term (current) use of antibiotics: Secondary | ICD-10-CM | POA: Insufficient documentation

## 2015-03-26 DIAGNOSIS — Z8739 Personal history of other diseases of the musculoskeletal system and connective tissue: Secondary | ICD-10-CM | POA: Insufficient documentation

## 2015-03-26 DIAGNOSIS — R197 Diarrhea, unspecified: Secondary | ICD-10-CM | POA: Insufficient documentation

## 2015-03-26 DIAGNOSIS — Z880 Allergy status to penicillin: Secondary | ICD-10-CM | POA: Insufficient documentation

## 2015-03-26 DIAGNOSIS — Z720 Tobacco use: Secondary | ICD-10-CM | POA: Insufficient documentation

## 2015-03-26 LAB — COMPREHENSIVE METABOLIC PANEL
ALT: 21 U/L (ref 0–35)
AST: 25 U/L (ref 0–37)
Albumin: 4.2 g/dL (ref 3.5–5.2)
Alkaline Phosphatase: 69 U/L (ref 39–117)
Anion gap: 8 (ref 5–15)
BUN: 16 mg/dL (ref 6–23)
CALCIUM: 9.3 mg/dL (ref 8.4–10.5)
CO2: 26 mmol/L (ref 19–32)
Chloride: 106 mmol/L (ref 96–112)
Creatinine, Ser: 0.88 mg/dL (ref 0.50–1.10)
GFR, EST NON AFRICAN AMERICAN: 81 mL/min — AB (ref >90)
GLUCOSE: 102 mg/dL — AB (ref 70–99)
POTASSIUM: 3.1 mmol/L — AB (ref 3.5–5.1)
SODIUM: 140 mmol/L (ref 135–145)
TOTAL PROTEIN: 7.5 g/dL (ref 6.0–8.3)
Total Bilirubin: 0.5 mg/dL (ref 0.3–1.2)

## 2015-03-26 LAB — CBC WITH DIFFERENTIAL/PLATELET
Basophils Absolute: 0 10*3/uL (ref 0.0–0.1)
Basophils Relative: 0 % (ref 0–1)
Eosinophils Absolute: 0.2 10*3/uL (ref 0.0–0.7)
Eosinophils Relative: 1 % (ref 0–5)
HCT: 38.5 % (ref 36.0–46.0)
HEMOGLOBIN: 12.7 g/dL (ref 12.0–15.0)
LYMPHS ABS: 2 10*3/uL (ref 0.7–4.0)
LYMPHS PCT: 13 % (ref 12–46)
MCH: 30.5 pg (ref 26.0–34.0)
MCHC: 33 g/dL (ref 30.0–36.0)
MCV: 92.5 fL (ref 78.0–100.0)
MONOS PCT: 7 % (ref 3–12)
Monocytes Absolute: 1.1 10*3/uL — ABNORMAL HIGH (ref 0.1–1.0)
NEUTROS ABS: 12 10*3/uL — AB (ref 1.7–7.7)
NEUTROS PCT: 79 % — AB (ref 43–77)
Platelets: 300 10*3/uL (ref 150–400)
RBC: 4.16 MIL/uL (ref 3.87–5.11)
RDW: 13.1 % (ref 11.5–15.5)
WBC: 15.3 10*3/uL — ABNORMAL HIGH (ref 4.0–10.5)

## 2015-03-26 MED ORDER — SODIUM CHLORIDE 0.9 % IV BOLUS (SEPSIS)
1000.0000 mL | Freq: Once | INTRAVENOUS | Status: AC
  Administered 2015-03-26: 1000 mL via INTRAVENOUS

## 2015-03-26 MED ORDER — ONDANSETRON 8 MG PO TBDP: 8.0000 mg | ORAL_TABLET | Freq: Three times a day (TID) | ORAL | Status: DC | PRN

## 2015-03-26 MED ORDER — ONDANSETRON HCL 4 MG/2ML IJ SOLN
4.0000 mg | Freq: Once | INTRAMUSCULAR | Status: AC
  Administered 2015-03-26: 4 mg via INTRAVENOUS
  Filled 2015-03-26: qty 2

## 2015-03-26 NOTE — ED Notes (Signed)
Pt c/o vomiting and diarrhea x 2 hours. Pt just started amoxicillin today.

## 2015-03-26 NOTE — ED Provider Notes (Signed)
CSN: [id number]     Arrival date & time 03/26/15  0248 History   First MD Initiated Contact with Patient 03/26/15 0315     Chief Complaint  Patient presents with  . Emesis      HPI Patient reports developing nausea vomiting diarrhea the past 2 hours.  Denies hematemesis.  No melena or hematochezia.  No fevers or chills.  She was recently started on Augmentin today for possible sinus infection.  She states her symptoms began approximately 2 hours after taking her initial dose of Augmentin.  No fevers or chills.  Denies abdominal pain.   Past Medical History  Diagnosis Date  . Chronic headaches   . Chronic neck pain     extending into right shoulder  . DDD (degenerative disc disease), cervical   . Chronic right shoulder pain    Past Surgical History  Procedure Laterality Date  . Back surgery     History reviewed. No pertinent family history. History  Substance Use Topics  . Smoking status: Current Every Day Smoker -- 0.50 packs/day  . Smokeless tobacco: Not on file  . Alcohol Use: No   OB History    No data available     Review of Systems  All other systems reviewed and are negative.     Allergies  Amoxicillin and Tramadol  Home Medications   Prior to Admission medications   Medication Sig Start Date End Date Taking? Authorizing Provider  acetaminophen (TYLENOL) 500 MG tablet Take 1,000 mg by mouth every 8 (eight) hours as needed. Pain    Historical Provider, MD  aspirin-acetaminophen-caffeine (EXCEDRIN MIGRAINE) [phone] MG per tablet Take 2 tablets by mouth daily as needed for headache.    Historical Provider, MD  azithromycin (ZITHROMAX) 250 MG tablet Take 1 tablet (250 mg total) by mouth daily. For the next 4 days (start 01/19/2014) 01/18/14   Samuel Jester, DO  ibuprofen (ADVIL,MOTRIN) 200 MG tablet Take 800 mg by mouth 2 (two) times daily as needed for headache.    Historical Provider, MD  ondansetron (ZOFRAN ODT) 8 MG disintegrating tablet Take 1 tablet (8  mg total) by mouth every 8 (eight) hours as needed for nausea or vomiting. 03/26/15   Azalia Bilis, MD  promethazine (PHENERGAN) 25 MG tablet Take 1 tablet (25 mg total) by mouth every 6 (six) hours as needed for nausea or vomiting (or headache). 01/18/14   Samuel Jester, DO   BP 107/63 mmHg  Pulse 66  Temp(Src) 98.2 F (36.8 C)  Resp 18  Ht 5' 6.5" (1.689 m)  Wt 183 lb (83.008 kg)  BMI 29.10 kg/m2  SpO2 100%  LMP 03/17/2015 Physical Exam  Constitutional: She is oriented to person, place, and time. She appears well-developed and well-nourished. No distress.  HENT:  Head: Normocephalic and atraumatic.  Eyes: EOM are normal.  Neck: Normal range of motion.  Cardiovascular: Normal rate, regular rhythm and normal heart sounds.   Pulmonary/Chest: Effort normal and breath sounds normal.  Abdominal: Soft. She exhibits no distension. There is no tenderness.  Musculoskeletal: Normal range of motion.  Neurological: She is alert and oriented to person, place, and time.  Skin: Skin is warm and dry.  Psychiatric: She has a normal mood and affect. Judgment normal.  Nursing note and vitals reviewed.   ED Course  Procedures (including critical care time) Labs Review Labs Reviewed  CBC WITH DIFFERENTIAL/PLATELET - Abnormal; Notable for the following:    WBC 15.3 (*)    Neutrophils Relative % 79 (*)  Neutro Abs 12.0 (*)    Monocytes Absolute 1.1 (*)    All other components within normal limits  COMPREHENSIVE METABOLIC PANEL - Abnormal; Notable for the following:    Potassium 3.1 (*)    Glucose, Bld 102 (*)    GFR calc non Af Amer 81 (*)    All other components within normal limits    Imaging Review No results found.   EKG Interpretation None      MDM   Final diagnoses:  Nausea vomiting and diarrhea    Patient feels much better at this time.  Repeat abdominal exam is benign.  Labs are normal.  Vital signs are normal.  Unclear if this is related to the antibiotic and not.   She will discuss this further with her primary care physician tomorrow.  I will not make any changes to her medications at this time.  Home with antinausea medicine.    Azalia BilisKevin Catharine Kettlewell, MD 03/26/15 [phone]

## 2016-04-13 ENCOUNTER — Encounter (HOSPITAL_COMMUNITY): Payer: Self-pay | Admitting: Emergency Medicine

## 2016-04-13 ENCOUNTER — Emergency Department (HOSPITAL_COMMUNITY)
Admission: EM | Admit: 2016-04-13 | Discharge: 2016-04-13 | Disposition: A | Discharge status: Home / Self Care | Payer: Managed Care, Other (non HMO) | Attending: Emergency Medicine | Admitting: Emergency Medicine

## 2016-04-13 DIAGNOSIS — Z7982 Long term (current) use of aspirin: Secondary | ICD-10-CM | POA: Diagnosis not present

## 2016-04-13 DIAGNOSIS — F17200 Nicotine dependence, unspecified, uncomplicated: Secondary | ICD-10-CM | POA: Diagnosis not present

## 2016-04-13 DIAGNOSIS — J029 Acute pharyngitis, unspecified: Secondary | ICD-10-CM | POA: Insufficient documentation

## 2016-04-13 DIAGNOSIS — R52 Pain, unspecified: Secondary | ICD-10-CM | POA: Diagnosis present

## 2016-04-13 DIAGNOSIS — J019 Acute sinusitis, unspecified: Secondary | ICD-10-CM | POA: Diagnosis not present

## 2016-04-13 DIAGNOSIS — R110 Nausea: Secondary | ICD-10-CM | POA: Diagnosis not present

## 2016-04-13 MED ORDER — CLARITHROMYCIN 500 MG PO TABS: 500.0000 mg | ORAL_TABLET | Freq: Two times a day (BID) | ORAL | Status: DC

## 2016-04-13 MED ORDER — HYDROCODONE-ACETAMINOPHEN 5-325 MG PO TABS: 1.0000 | ORAL_TABLET | ORAL | Status: DC | PRN

## 2016-04-13 NOTE — ED Notes (Signed)
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

## 2016-04-13 NOTE — Discharge Instructions (Signed)
Use Flonase Nasal spray for 1 month, for allergic rhinitis. Use Afrin nasal spray twice a day for nasal congestion. Try to stop smoking.   Sinusitis, Adult Sinusitis is redness, soreness, and inflammation of the paranasal sinuses. Paranasal sinuses are air pockets within the bones of your face. They are located beneath your eyes, in the middle of your forehead, and above your eyes. In healthy paranasal sinuses, mucus is able to drain out, and air is able to circulate through them by way of your nose. However, when your paranasal sinuses are inflamed, mucus and air can become trapped. This can allow bacteria and other germs to grow and cause infection. Sinusitis can develop quickly and last only a short time (acute) or continue over a long period (chronic). Sinusitis that lasts for more than 12 weeks is considered chronic. CAUSES Causes of sinusitis include:  Allergies.  Structural abnormalities, such as displacement of the cartilage that separates your nostrils (deviated septum), which can decrease the air flow through your nose and sinuses and affect sinus drainage.  Functional abnormalities, such as when the small hairs (cilia) that line your sinuses and help remove mucus do not work properly or are not present. SIGNS AND SYMPTOMS Symptoms of acute and chronic sinusitis are the same. The primary symptoms are pain and pressure around the affected sinuses. Other symptoms include:  Upper toothache.  Earache.  Headache.  Bad breath.  Decreased sense of smell and taste.  A cough, which worsens when you are lying flat.  Fatigue.  Fever.  Thick drainage from your nose, which often is green and may contain pus (purulent).  Swelling and warmth over the affected sinuses. DIAGNOSIS Your health care provider will perform a physical exam. During your exam, your health care provider may perform any of the following to help determine if you have acute sinusitis or chronic sinusitis:  Look  in your nose for signs of abnormal growths in your nostrils (nasal polyps).  Tap over the affected sinus to check for signs of infection.  View the inside of your sinuses using an imaging device that has a light attached (endoscope). If your health care provider suspects that you have chronic sinusitis, one or more of the following tests may be recommended:  Allergy tests.  Nasal culture. A sample of mucus is taken from your nose, sent to a lab, and screened for bacteria.  Nasal cytology. A sample of mucus is taken from your nose and examined by your health care provider to determine if your sinusitis is related to an allergy. TREATMENT Most cases of acute sinusitis are related to a viral infection and will resolve on their own within 10 days. Sometimes, medicines are prescribed to help relieve symptoms of both acute and chronic sinusitis. These may include pain medicines, decongestants, nasal steroid sprays, or saline sprays. However, for sinusitis related to a bacterial infection, your health care provider will prescribe antibiotic medicines. These are medicines that will help kill the bacteria causing the infection. Rarely, sinusitis is caused by a fungal infection. In these cases, your health care provider will prescribe antifungal medicine. For some cases of chronic sinusitis, surgery is needed. Generally, these are cases in which sinusitis recurs more than 3 times per year, despite other treatments. HOME CARE INSTRUCTIONS  Drink plenty of water. Water helps thin the mucus so your sinuses can drain more easily.  Use a humidifier.  Inhale steam 3-4 times a day (for example, sit in the bathroom with the shower running).  Apply  a warm, moist washcloth to your face 3-4 times a day, or as directed by your health care provider.  Use saline nasal sprays to help moisten and clean your sinuses.  Take medicines only as directed by your health care provider.  If you were prescribed either an  antibiotic or antifungal medicine, finish it all even if you start to feel better. SEEK IMMEDIATE MEDICAL CARE IF:  You have increasing pain or severe headaches.  You have nausea, vomiting, or drowsiness.  You have swelling around your face.  You have vision problems.  You have a stiff neck.  You have difficulty breathing.   This information is not intended to replace advice given to you by your health care provider. Make sure you discuss any questions you have with your health care provider.   Document Released: 11/15/2005 Document Revised: 12/06/2014 Document Reviewed: 11/30/2011 Elsevier Interactive Patient Education [employer]

## 2016-04-13 NOTE — ED Provider Notes (Signed)
CSN: [id number]     Arrival date & time 04/13/16  1238 History  By signing my name below, I, Emmanuella Mensah, attest that this documentation has been prepared under the direction and in the presence of Mancel BaleElliott Makenize Messman, MD. Electronically Signed: Angelene GiovanniEmmanuella Mensah, ED Scribe. 04/13/2016. 1:02 PM.    Chief Complaint  Patient presents with  . Generalized Body Aches   The history is provided by the patient. No language interpreter was used.   HPI Comments: Kelly Nicholson is a 45 y.o. female who presents to the Emergency Department complaining of gradually worsening generalized body aches onset 2 days. She reports associated generalized HA,  non-productive cough, chills, sore throat, sinus pressure, nausea. No alleviating factors noted. Pt has not tried any medication PTA. She is a current smoker. She denies any fever or ear pain.   Past Medical History  Diagnosis Date  . Chronic headaches   . Chronic neck pain     extending into right shoulder  . DDD (degenerative disc disease), cervical   . Chronic right shoulder pain    Past Surgical History  Procedure Laterality Date  . Back surgery     No family history on file. Social History  Substance Use Topics  . Smoking status: Current Every Day Smoker -- 0.50 packs/day  . Smokeless tobacco: None  . Alcohol Use: No   OB History    No data available     Review of Systems  Constitutional: Positive for chills. Negative for fever.  HENT: Positive for congestion, rhinorrhea, sinus pressure and sore throat. Negative for ear pain.   Respiratory: Positive for cough.   Gastrointestinal: Positive for nausea. Negative for vomiting.  Musculoskeletal: Positive for myalgias.  Neurological: Positive for headaches.  All other systems reviewed and are negative.     Allergies  Amoxicillin and Tramadol  Home Medications   Prior to Admission medications   Medication Sig Start Date End Date Taking? Authorizing Provider  acetaminophen (TYLENOL)  500 MG tablet Take 1,000 mg by mouth every 8 (eight) hours as needed. Pain    Historical Provider, MD  aspirin-acetaminophen-caffeine (EXCEDRIN MIGRAINE) [id number] MG per tablet Take 2 tablets by mouth daily as needed for headache.    Historical Provider, MD  azithromycin (ZITHROMAX) 250 MG tablet Take 1 tablet (250 mg total) by mouth daily. For the next 4 days (start 01/19/2014) 01/18/14   Samuel JesterKathleen McManus, DO  clarithromycin (BIAXIN) 500 MG tablet Take 1 tablet (500 mg total) by mouth 2 (two) times daily. 04/13/16   Mancel BaleElliott Tawonna Esquer, MD  HYDROcodone-acetaminophen (NORCO) 5-325 MG tablet Take 1-2 tablets by mouth every 4 (four) hours as needed. 04/13/16   Mancel BaleElliott Aasir Daigler, MD  ibuprofen (ADVIL,MOTRIN) 200 MG tablet Take 800 mg by mouth 2 (two) times daily as needed for headache.    Historical Provider, MD  ondansetron (ZOFRAN ODT) 8 MG disintegrating tablet Take 1 tablet (8 mg total) by mouth every 8 (eight) hours as needed for nausea or vomiting. 03/26/15   Azalia BilisKevin Campos, MD  promethazine (PHENERGAN) 25 MG tablet Take 1 tablet (25 mg total) by mouth every 6 (six) hours as needed for nausea or vomiting (or headache). 01/18/14   Samuel JesterKathleen McManus, DO   BP 114/52 mmHg  Pulse 78  Temp(Src) 98.6 F (37 C) (Oral)  Resp 20  Ht 5\' 7"  (1.702 m)  Wt 193 lb (87.544 kg)  BMI 30.22 kg/m2  SpO2 100%  LMP 04/11/2016 Physical Exam  Constitutional: She is oriented to person, place, and time.  She appears well-developed and well-nourished.  HENT:  Head: Normocephalic and atraumatic.  Mouth/Throat: Posterior oropharyngeal erythema present. No oropharyngeal exudate.  Cardiovascular: Normal rate.   Pulmonary/Chest: Effort normal.  Neurological: She is alert and oriented to person, place, and time.  Skin: Skin is warm and dry.  Psychiatric: She has a normal mood and affect.  Nursing note and vitals reviewed.   ED Course  Procedures (including critical care time) DIAGNOSTIC STUDIES: Oxygen Saturation is 100% on RA,  normal by my interpretation.    COORDINATION OF CARE: 1:00 PM- Pt advised of plan for treatment and pt agrees. Pt will receive antibiotics, Flonase, and Afrin.    MDM   Final diagnoses:  Acute sinusitis, recurrence not specified, unspecified location    Sinusitis, without systemic illness. Suspect postnasal drip causing cough. Doubt serious bacterial infection, or metabolic instability.  Nursing Notes Reviewed/ Care Coordinated Applicable Imaging Reviewed Interpretation of Laboratory Data incorporated into ED treatment  The patient appears reasonably screened and/or stabilized for discharge and I doubt any other medical condition or other [facility] requiring further screening, evaluation, or treatment in the ED at this time prior to discharge.  Plan: Home Medications- Biaxin, Flonase, Afrin; Home Treatments- rest; return here if the recommended treatment, does not improve the symptoms; Recommended follow up- PCP prn   I personally performed the services described in this documentation, which was scribed in my presence. The recorded information has been reviewed and is accurate.     Mancel Bale, MD 04/13/16 [phone]

## 2016-04-13 NOTE — ED Notes (Signed)
EDP at bedside  

## 2016-11-03 ENCOUNTER — Ambulatory Visit (INDEPENDENT_AMBULATORY_CARE_PROVIDER_SITE_OTHER): Payer: Managed Care, Other (non HMO) | Admitting: Orthopaedic Surgery

## 2016-11-03 ENCOUNTER — Ambulatory Visit (INDEPENDENT_AMBULATORY_CARE_PROVIDER_SITE_OTHER): Payer: Managed Care, Other (non HMO)

## 2016-11-03 ENCOUNTER — Encounter: Payer: Self-pay | Admitting: Orthopaedic Surgery

## 2016-11-03 VITALS — BP 99/65 | HR 61 | Temp 97.7°F | Ht 66.0 in | Wt 192.0 lb

## 2016-11-03 DIAGNOSIS — M542 Cervicalgia: Secondary | ICD-10-CM

## 2016-11-03 MED ORDER — NAPROXEN 500 MG PO TABS: 500.0000 mg | ORAL_TABLET | Freq: Two times a day (BID) | ORAL | 5 refills | Status: DC

## 2016-11-03 MED ORDER — HYDROCODONE-ACETAMINOPHEN 5-325 MG PO TABS: 1.0000 | ORAL_TABLET | ORAL | 0 refills | Status: DC | PRN

## 2016-11-03 NOTE — Patient Instructions (Signed)
Begin PT. 

## 2016-11-03 NOTE — Progress Notes (Signed)
Subjective:    Patient ID: Kelly Nicholson, female    DOB: 06/24/1971, 45 y.o.   MRN: [id number]  HPI  She has had neck pain on and off for years.  She has had more neck pain over the last few months.  She has no trauma, no untoward event.  She has some pain going to the right thumb and index finger. She has tried ice, heat, Goody powders and ibuprofen with little help.  She has tried some neck exercises which did not help.  Her pain has gotten worse.  She has headaches as well.    Review of Systems  HENT: Negative for congestion.   Cardiovascular: Negative for chest pain and leg swelling.  Endocrine: Positive for cold intolerance.  Musculoskeletal: Positive for arthralgias, myalgias and neck pain.  Allergic/Immunologic: Positive for environmental allergies.  Neurological: Positive for headaches.   Past Medical History:  Diagnosis Date  . Chronic headaches   . Chronic neck pain    extending into right shoulder  . Chronic right shoulder pain   . DDD (degenerative disc disease), cervical     Past Surgical History:  Procedure Laterality Date  . BACK SURGERY      Current Outpatient Prescriptions on File Prior to Visit  Medication Sig Dispense Refill  . acetaminophen (TYLENOL) 500 MG tablet Take 1,000 mg by mouth every 8 (eight) hours as needed. Pain    . aspirin-acetaminophen-caffeine (EXCEDRIN MIGRAINE) 250-250-65 MG per tablet Take 2 tablets by mouth daily as needed for headache.    Marland Kitchen. azithromycin (ZITHROMAX) 250 MG tablet Take 1 tablet (250 mg total) by mouth daily. For the next 4 days (start 01/19/2014) (Patient not taking: Reported on 11/03/2016) 4 tablet 0  . clarithromycin (BIAXIN) 500 MG tablet Take 1 tablet (500 mg total) by mouth 2 (two) times daily. (Patient not taking: Reported on 11/03/2016) 20 tablet 0  . ondansetron (ZOFRAN ODT) 8 MG disintegrating tablet Take 1 tablet (8 mg total) by mouth every 8 (eight) hours as needed for nausea or vomiting. (Patient not taking:  Reported on 11/03/2016) 10 tablet 0  . promethazine (PHENERGAN) 25 MG tablet Take 1 tablet (25 mg total) by mouth every 6 (six) hours as needed for nausea or vomiting (or headache). (Patient not taking: Reported on 11/03/2016) 8 tablet 0   No current facility-administered medications on file prior to visit.     Social History   Social History  . Marital status: Single    Spouse name: N/A  . Number of children: N/A  . Years of education: N/A   Occupational History  . Not on file.   Social History Main Topics  . Smoking status: Former Smoker    Packs/day: 0.50  . Smokeless tobacco: Never Used  . Alcohol use No  . Drug use: No  . Sexual activity: Yes    Birth control/ protection: None   Other Topics Concern  . Not on file   Social History Narrative  . No narrative on file    Family History  Problem Relation Age of Onset  . Fibromyalgia Mother   . Osteoarthritis Mother   . Diabetes Paternal Grandmother   . Hypertension Paternal Grandmother     BP 99/65   Pulse 61   Temp 97.7 F (36.5 C)   Ht 5\' 6"  (1.676 m)   Wt 192 lb (87.1 kg)   LMP 10/27/2016 (Exact Date)   BMI 30.99 kg/m      Objective:   Physical Exam  Constitutional: She is oriented to person, place, and time. She appears well-developed and well-nourished.  HENT:  Head: Normocephalic and atraumatic.  Eyes: Conjunctivae and EOM are normal. Pupils are equal, round, and reactive to light.  Neck: Normal range of motion. Neck supple.  Cardiovascular: Normal rate, regular rhythm and intact distal pulses.   Pulmonary/Chest: Effort normal.  Abdominal: Soft.  Musculoskeletal: She exhibits tenderness (Neck is tender more on the right side of posterior neck. She has no swelling.  ROM is full but tender.  She has right sided paresthesias to the right thumb and index finger.).  Neurological: She is alert and oriented to person, place, and time. She displays normal reflexes. No cranial nerve deficit. She exhibits  normal muscle tone. Coordination normal.  Skin: Skin is warm and dry.  Psychiatric: She has a normal mood and affect. Her behavior is normal. Judgment and thought content normal.    X-rays of the cervical spine were done, reported separately.      Assessment & Plan:   Encounter Diagnosis  Name Primary?  . Cervicalgia Yes   Begin PT.  Begin Naprosyn and pain medicine.  Return in two weeks.  She may need MRI of the cervical spine.  Call if any problem.  Precautions discussed.  Electronically Signed Darreld McleanWayne Berneice Zettlemoyer, MD 12/6/201711:32 AM

## 2016-11-04 ENCOUNTER — Ambulatory Visit: Payer: Managed Care, Other (non HMO) | Admitting: Orthopaedic Surgery

## 2016-11-17 ENCOUNTER — Telehealth: Payer: Self-pay | Admitting: Orthopaedic Surgery

## 2016-11-17 ENCOUNTER — Ambulatory Visit: Payer: Managed Care, Other (non HMO) | Admitting: Orthopaedic Surgery

## 2016-11-17 MED ORDER — HYDROCODONE-ACETAMINOPHEN 5-325 MG PO TABS: 1.0000 | ORAL_TABLET | ORAL | 0 refills | Status: DC | PRN

## 2016-11-17 NOTE — Telephone Encounter (Signed)
Patient called to cancel appointment for this morning, 11/17/16, due to work - states intended to call yesterday.  She has requested refill - relayed that Dr reviews medication requests at time of scheduled appointment; patient has not yet re-scheduled; also needs to re-schedule therapy.  Please advise - patient's cell#[phone]

## 2016-12-08 ENCOUNTER — Telehealth: Payer: Self-pay | Admitting: Orthopaedic Surgery

## 2016-12-08 ENCOUNTER — Other Ambulatory Visit: Payer: Self-pay | Admitting: *Deleted

## 2016-12-08 MED ORDER — HYDROCODONE-ACETAMINOPHEN 5-325 MG PO TABS: 1.0000 | ORAL_TABLET | ORAL | 0 refills | Status: DC | PRN

## 2016-12-08 NOTE — Telephone Encounter (Signed)
Dr. Sanjuan DameKeeling's patient requests refill on Hydrocodone/Acetaminophen ( Norco)  5-325  Mgs.    Qty  50       Sig: Take 1 tablet by mouth every 4 (four) hours as needed for moderate pain (Must last 14 days.Do not take and drive a car or use machinery.).

## 2016-12-09 ENCOUNTER — Ambulatory Visit: Payer: Managed Care, Other (non HMO) | Admitting: Orthopaedic Surgery

## 2016-12-16 ENCOUNTER — Ambulatory Visit: Payer: Managed Care, Other (non HMO) | Admitting: Orthopaedic Surgery

## 2017-11-02 ENCOUNTER — Encounter: Payer: Self-pay | Admitting: Orthopaedic Surgery

## 2017-11-02 ENCOUNTER — Ambulatory Visit (INDEPENDENT_AMBULATORY_CARE_PROVIDER_SITE_OTHER): Payer: Managed Care, Other (non HMO)

## 2017-11-02 ENCOUNTER — Ambulatory Visit (INDEPENDENT_AMBULATORY_CARE_PROVIDER_SITE_OTHER): Payer: Managed Care, Other (non HMO) | Admitting: Orthopaedic Surgery

## 2017-11-02 VITALS — BP 98/67 | HR 65 | Ht 66.0 in | Wt 192.0 lb

## 2017-11-02 DIAGNOSIS — M542 Cervicalgia: Secondary | ICD-10-CM

## 2017-11-02 MED ORDER — HYDROCODONE-ACETAMINOPHEN 7.5-325 MG PO TABS: ORAL_TABLET | ORAL | 0 refills | Status: DC

## 2017-11-02 MED ORDER — PREDNISONE 5 MG (21) PO TBPK: ORAL_TABLET | ORAL | 0 refills | Status: DC

## 2017-11-02 NOTE — Patient Instructions (Addendum)
..Your MRI has been ordered.  We will contact your insurance company for approval. After the authorization is received the MRI and a return appointment will be scheduled with you by phone.  If you do not hear from us within 5 business days please call [phone] and ask for the pre-authorization representative in our office.      Steps to Quit Smoking Smoking tobacco can be bad for your health. It can also affect almost every organ in your body. Smoking puts you and people around you at risk for many serious [place]-lasting (chronic) diseases. Quitting smoking is hard, but it is one of the best things that you can do for your health. It is never too late to quit. What are the benefits of quitting smoking? When you quit smoking, you lower your risk for getting serious diseases and conditions. They can include:  Lung cancer or lung disease.  Heart disease.  Stroke.  Heart attack.  Not being able to have children (infertility).  Weak bones (osteoporosis) and broken bones (fractures).  If you have coughing, wheezing, and shortness of breath, those symptoms may get better when you quit. You may also get sick less often. If you are pregnant, quitting smoking can help to lower your chances of having a baby of low birth weight. What can I do to help me quit smoking? Talk with your doctor about what can help you quit smoking. Some things you can do (strategies) include:  Quitting smoking totally, instead of slowly cutting back how much you smoke over a period of time.  Going to in-person counseling. You are more likely to quit if you go to many counseling sessions.  Using resources and support systems, such as: ? Agricultural engineernline chats with a Veterinary surgeoncounselor. ? Phone quitlines. ? Automotive engineerrinted self-help materials. ? Support groups or group counseling. ? Text messaging programs. ? Mobile phone apps or applications.  Taking medicines. Some of these medicines may have nicotine in them. If you are pregnant or  breastfeeding, do not take any medicines to quit smoking unless your doctor says it is okay. Talk with your doctor about counseling or other things that can help you.  Talk with your doctor about using more than one strategy at the same time, such as taking medicines while you are also going to in-person counseling. This can help make quitting easier. What things can I do to make it easier to quit? Quitting smoking might feel very hard at first, but there is a lot that you can do to make it easier. Take these steps:  Talk to your family and friends. Ask them to support and encourage you.  Call phone quitlines, reach out to support groups, or work with a Veterinary surgeoncounselor.  Ask people who smoke to not smoke around you.  Avoid places that make you want (trigger) to smoke, such as: ? Bars. ? Parties. ? Smoke-break areas at work.  Spend time with people who do not smoke.  Lower the stress in your life. Stress can make you want to smoke. Try these things to help your stress: ? Getting regular exercise. ? Deep-breathing exercises. ? Yoga. ? Meditating. ? Doing a body scan. To do this, close your eyes, focus on one area of your body at a time from head to toe, and notice which parts of your body are tense. Try to relax the muscles in those areas.  Download or buy apps on your mobile phone or tablet that can help you stick to your quit plan.  There are many free apps, such as QuitGuide from the [employer] Office manager for Disease Control and Prevention). You can find more support from smokefree.gov and other websites.  This information is not intended to replace advice given to you by your health care provider. Make sure you discuss any questions you have with your health care provider. Document Released: 09/11/2009 Document Revised: 07/13/2016 Document Reviewed: 04/01/2015 Elsevier Interactive Patient Education  2018 [employer].

## 2017-11-02 NOTE — Progress Notes (Signed)
Patient WU:[id number]:Kelly Nicholson, female DOB:[birth year], 46 y.o. JYN:[id number]  Chief Complaint  Patient presents with  . New Problem    Bilateral shoulder pain Right > Left  neck pain    HPI  Kelly Nicholson is a 46 y.o. female who has increasing pain to the right shoulder but actually on further questioning it is pain running down to the right hand and causing numbness of the index finger.  This has gotten worse over the last few weeks.  I saw her a year ago for neck pain and shoulder pain.  I gave her Naprosyn which helped.  She stopped the medicine in spring.  She has no trauma.  She has pain at rest, numbness at rest.  She does not sleep well.   HPI  Body mass index is 30.99 kg/m.  ROS  Review of Systems  HENT: Negative for congestion.   Cardiovascular: Negative for chest pain and leg swelling.  Endocrine: Positive for cold intolerance.  Musculoskeletal: Positive for arthralgias, myalgias and neck pain.  Allergic/Immunologic: Positive for environmental allergies.  Neurological: Positive for headaches.  All other systems reviewed and are negative.   Past Medical History:  Diagnosis Date  . Chronic headaches   . Chronic neck pain    extending into right shoulder  . Chronic right shoulder pain   . DDD (degenerative disc disease), cervical     Past Surgical History:  Procedure Laterality Date  . BACK SURGERY      Family History  Problem Relation Age of Onset  . Fibromyalgia Mother   . Osteoarthritis Mother   . Diabetes Paternal Grandmother   . Hypertension Paternal Grandmother     Social History Social History   Tobacco Use  . Smoking status: Current Some Day Smoker    Packs/day: 0.50  . Smokeless tobacco: Never Used  Substance Use Topics  . Alcohol use: No  . Drug use: No    Allergies  Allergen Reactions  . Amoxicillin Nausea And Vomiting  . Tramadol     Current Outpatient Medications  Medication Sig Dispense Refill  . acetaminophen (TYLENOL) 500 MG tablet  Take 1,000 mg by mouth every 8 (eight) hours as needed. Pain    . aspirin-acetaminophen-caffeine (EXCEDRIN MIGRAINE) 250-250-65 MG per tablet Take 2 tablets by mouth daily as needed for headache.    Marland Kitchen. azithromycin (ZITHROMAX) 250 MG tablet Take 1 tablet (250 mg total) by mouth daily. For the next 4 days (start 01/19/2014) (Patient not taking: Reported on 11/03/2016) 4 tablet 0  . clarithromycin (BIAXIN) 500 MG tablet Take 1 tablet (500 mg total) by mouth 2 (two) times daily. (Patient not taking: Reported on 11/03/2016) 20 tablet 0  . HYDROcodone-acetaminophen (NORCO/VICODIN) 5-325 MG tablet Take 1 tablet by mouth every 4 (four) hours as needed for moderate pain (Must last 14 days.Do not take and drive a car or use machinery.). 50 tablet 0  . naproxen (NAPROSYN) 500 MG tablet Take 1 tablet (500 mg total) by mouth 2 (two) times daily with a meal. 60 tablet 5  . ondansetron (ZOFRAN ODT) 8 MG disintegrating tablet Take 1 tablet (8 mg total) by mouth every 8 (eight) hours as needed for nausea or vomiting. (Patient not taking: Reported on 11/03/2016) 10 tablet 0  . promethazine (PHENERGAN) 25 MG tablet Take 1 tablet (25 mg total) by mouth every 6 (six) hours as needed for nausea or vomiting (or headache). (Patient not taking: Reported on 11/03/2016) 8 tablet 0   No current facility-administered medications  for this visit.      Physical Exam  Blood pressure 98/67, pulse 65, height 5\' 6"  (1.676 m), weight 192 lb (87.1 kg), last menstrual period 10/24/2017.  Constitutional: overall normal hygiene, normal nutrition, well developed, normal grooming, normal body habitus. Assistive device:none  Musculoskeletal: gait and station Limp none, muscle tone and strength are normal, no tremors or atrophy is present.  .  Neurological: coordination overall normal.  Deep tendon reflex/nerve stretch intact.  Sensation normal.  Cranial nerves II-XII intact.   Skin:   Normal overall no scars, lesions, ulcers or rashes. No  psoriasis.  Psychiatric: Alert and oriented x 3.  Recent memory intact, remote memory unclear.  Normal mood and affect. Well groomed.  Good eye contact.  Cardiovascular: overall no swelling, no varicosities, no edema bilaterally, normal temperatures of the legs and arms, no clubbing, cyanosis and good capillary refill.  Lymphatic: palpation is normal.  All other systems reviewed and are negative   Her neck is slightly tender.  She has more tenderness on the right side.  She has slight numbness of the right index finger.  Motor and strength are normal. Reflexes are normal.  Grips are normal.  The patient has been educated about the nature of the problem(s) and counseled on treatment options.  The patient appeared to understand what I have discussed and is in agreement with it.  Encounter Diagnosis  Name Primary?  . Neck pain Yes   X-rays were done of the cervical spine, reported separately.  PLAN Call if any problems.  Precautions discussed.  I will begin prednisone dose pack.  I am very concerned about a HNP of the cervical spine involving the C6, C7 nerve roots.  I will order a MRI.  The patient is agreeable to this.  Return to clinic after MRI of the cervical spine.  I have reviewed the [place] Controlled Substance Reporting System web site prior to prescribing narcotic medicine for this patient.   Electronically Signed Darreld McleanWayne Sabena Winner, MD 12/5/201810:47 AM

## 2017-11-08 ENCOUNTER — Encounter: Payer: Self-pay | Admitting: Radiology

## 2017-12-06 ENCOUNTER — Ambulatory Visit (INDEPENDENT_AMBULATORY_CARE_PROVIDER_SITE_OTHER): Payer: Managed Care, Other (non HMO) | Admitting: Orthopaedic Surgery

## 2017-12-06 ENCOUNTER — Encounter: Payer: Self-pay | Admitting: Orthopaedic Surgery

## 2017-12-06 VITALS — BP 101/68 | HR 58 | Temp 97.3°F | Ht 66.0 in | Wt 192.0 lb

## 2017-12-06 DIAGNOSIS — M542 Cervicalgia: Secondary | ICD-10-CM | POA: Diagnosis not present

## 2017-12-06 MED ORDER — HYDROCODONE-ACETAMINOPHEN 7.5-325 MG PO TABS: ORAL_TABLET | ORAL | 0 refills | Status: DC

## 2017-12-06 NOTE — Progress Notes (Signed)
Patient BM:WUXLK:Kelly Nicholson, female DOB:06/22/1971, 47 y.o. GMW:[id number]  Chief Complaint  Patient presents with  . Follow-up    Neck    HPI  Kelly Nicholson is a 47 y.o. female who has neck pain with right sided paresthesias to the index finger and thumb.  She got better with the prednisone but the pain has returned.  I would like to get a MRI of the neck. It was denied before. She is no better. HPI  Body mass index is 30.99 kg/m.  ROS  Review of Systems  HENT: Negative for congestion.   Cardiovascular: Negative for chest pain and leg swelling.  Endocrine: Positive for cold intolerance.  Musculoskeletal: Positive for arthralgias, myalgias and neck pain.  Allergic/Immunologic: Positive for environmental allergies.  Neurological: Positive for headaches.  All other systems reviewed and are negative.   Past Medical History:  Diagnosis Date  . Chronic headaches   . Chronic neck pain    extending into right shoulder  . Chronic right shoulder pain   . DDD (degenerative disc disease), cervical     Past Surgical History:  Procedure Laterality Date  . BACK SURGERY      Family History  Problem Relation Age of Onset  . Fibromyalgia Mother   . Osteoarthritis Mother   . Diabetes Paternal Grandmother   . Hypertension Paternal Grandmother     Social History Social History   Tobacco Use  . Smoking status: Current Some Day Smoker    Packs/day: 0.50  . Smokeless tobacco: Never Used  Substance Use Topics  . Alcohol use: No  . Drug use: No    Allergies  Allergen Reactions  . Amoxicillin Nausea And Vomiting  . Tramadol     Current Outpatient Medications  Medication Sig Dispense Refill  . acetaminophen (TYLENOL) 500 MG tablet Take 1,000 mg by mouth every 8 (eight) hours as needed. Pain    . aspirin-acetaminophen-caffeine (EXCEDRIN MIGRAINE) 250-250-65 MG per tablet Take 2 tablets by mouth daily as needed for headache.    Marland Kitchen. azithromycin (ZITHROMAX) 250 MG tablet Take 1  tablet (250 mg total) by mouth daily. For the next 4 days (start 01/19/2014) (Patient not taking: Reported on 11/03/2016) 4 tablet 0  . clarithromycin (BIAXIN) 500 MG tablet Take 1 tablet (500 mg total) by mouth 2 (two) times daily. (Patient not taking: Reported on 11/03/2016) 20 tablet 0  . HYDROcodone-acetaminophen (NORCO) 7.5-325 MG tablet One every four hours for pain as needed.  Do not drive car or operate machinery while taking this medicine.  Must last 14 days. 56 tablet 0  . naproxen (NAPROSYN) 500 MG tablet Take 1 tablet (500 mg total) by mouth 2 (two) times daily with a meal. 60 tablet 5  . ondansetron (ZOFRAN ODT) 8 MG disintegrating tablet Take 1 tablet (8 mg total) by mouth every 8 (eight) hours as needed for nausea or vomiting. (Patient not taking: Reported on 11/03/2016) 10 tablet 0  . predniSONE (STERAPRED UNI-PAK 21 TAB) 5 MG (21) TBPK tablet Take 6 pills first day; 5 pills second day; 4 pills third day; 3 pills fourth day; 2 pills next day and 1 pill last day. 21 tablet 0  . promethazine (PHENERGAN) 25 MG tablet Take 1 tablet (25 mg total) by mouth every 6 (six) hours as needed for nausea or vomiting (or headache). (Patient not taking: Reported on 11/03/2016) 8 tablet 0   No current facility-administered medications for this visit.      Physical Exam  Blood pressure  101/68, pulse (!) 58, temperature (!) 97.3 F (36.3 C), height 5\' 6"  (1.676 m), weight 192 lb (87.1 kg).  Constitutional: overall normal hygiene, normal nutrition, well developed, normal grooming, normal body habitus. Assistive device:none  Musculoskeletal: gait and station Limp none, muscle tone and strength are normal, no tremors or atrophy is present.  .  Neurological: coordination overall normal.  Deep tendon reflex/nerve stretch intact.  Sensation normal.  Cranial nerves II-XII intact.   Skin:   Normal overall no scars, lesions, ulcers or rashes. No psoriasis.  Psychiatric: Alert and oriented x 3.  Recent  memory intact, remote memory unclear.  Normal mood and affect. Well groomed.  Good eye contact.  Cardiovascular: overall no swelling, no varicosities, no edema bilaterally, normal temperatures of the legs and arms, no clubbing, cyanosis and good capillary refill.  Lymphatic: palpation is normal.  All other systems reviewed and are negative   Neck is tender but has full ROM.  She has slight decreased sensation of the dorsum of the right index finger.  NV intact.  The patient has been educated about the nature of the problem(s) and counseled on treatment options.  The patient appeared to understand what I have discussed and is in agreement with it.  Encounter Diagnosis  Name Primary?  . Neck pain Yes    PLAN Call if any problems.  Precautions discussed.  Continue current medications.   Return to clinic after MRI of the neck   I have reviewed the [facility] Controlled Substance Reporting System web site prior to prescribing narcotic medicine for this patient.  Electronically Signed Darreld Mclean, MD 1/8/20199:45 AM

## 2017-12-06 NOTE — Addendum Note (Signed)
Addended by: Debby BudLONG, [place] Thresher R on: 12/06/2017 10:26 AM   Modules accepted: Orders

## 2017-12-06 NOTE — Patient Instructions (Signed)
Steps to Quit Smoking Smoking tobacco can be bad for your health. It can also affect almost every organ in your body. Smoking puts you and people around you at risk for many serious Arif Amendola-lasting (chronic) diseases. Quitting smoking is hard, but it is one of the best things that you can do for your health. It is never too late to quit. What are the benefits of quitting smoking? When you quit smoking, you lower your risk for getting serious diseases and conditions. They can include:  Lung cancer or lung disease.  Heart disease.  Stroke.  Heart attack.  Not being able to have children (infertility).  Weak bones (osteoporosis) and broken bones (fractures).  If you have coughing, wheezing, and shortness of breath, those symptoms may get better when you quit. You may also get sick less often. If you are pregnant, quitting smoking can help to lower your chances of having a baby of low birth weight. What can I do to help me quit smoking? Talk with your doctor about what can help you quit smoking. Some things you can do (strategies) include:  Quitting smoking totally, instead of slowly cutting back how much you smoke over a period of time.  Going to in-person counseling. You are more likely to quit if you go to many counseling sessions.  Using resources and support systems, such as: ? Online chats with a counselor. ? Phone quitlines. ? Printed self-help materials. ? Support groups or group counseling. ? Text messaging programs. ? Mobile phone apps or applications.  Taking medicines. Some of these medicines may have nicotine in them. If you are pregnant or breastfeeding, do not take any medicines to quit smoking unless your doctor says it is okay. Talk with your doctor about counseling or other things that can help you.  Talk with your doctor about using more than one strategy at the same time, such as taking medicines while you are also going to in-person counseling. This can help make  quitting easier. What things can I do to make it easier to quit? Quitting smoking might feel very hard at first, but there is a lot that you can do to make it easier. Take these steps:  Talk to your family and friends. Ask them to support and encourage you.  Call phone quitlines, reach out to support groups, or work with a counselor.  Ask people who smoke to not smoke around you.  Avoid places that make you want (trigger) to smoke, such as: ? Bars. ? Parties. ? Smoke-break areas at work.  Spend time with people who do not smoke.  Lower the stress in your life. Stress can make you want to smoke. Try these things to help your stress: ? Getting regular exercise. ? Deep-breathing exercises. ? Yoga. ? Meditating. ? Doing a body scan. To do this, close your eyes, focus on one area of your body at a time from head to toe, and notice which parts of your body are tense. Try to relax the muscles in those areas.  Download or buy apps on your mobile phone or tablet that can help you stick to your quit plan. There are many free apps, such as QuitGuide from the CDC (Centers for Disease Control and Prevention). You can find more support from smokefree.gov and other websites.  This information is not intended to replace advice given to you by your health care provider. Make sure you discuss any questions you have with your health care provider. Document Released: 09/11/2009 Document   Revised: 07/13/2016 Document Reviewed: 04/01/2015 Elsevier Interactive Patient Education  2018 Elsevier Inc.  

## 2018-01-03 ENCOUNTER — Ambulatory Visit: Payer: Managed Care, Other (non HMO) | Admitting: Orthopaedic Surgery

## 2018-01-05 ENCOUNTER — Encounter: Payer: Self-pay | Admitting: Orthopaedic Surgery

## 2018-01-05 ENCOUNTER — Ambulatory Visit (INDEPENDENT_AMBULATORY_CARE_PROVIDER_SITE_OTHER): Payer: Managed Care, Other (non HMO) | Admitting: Orthopaedic Surgery

## 2018-01-05 VITALS — BP 95/63 | HR 68 | Ht 66.0 in | Wt 194.0 lb

## 2018-01-05 DIAGNOSIS — M542 Cervicalgia: Secondary | ICD-10-CM

## 2018-01-05 MED ORDER — HYDROCODONE-ACETAMINOPHEN 7.5-325 MG PO TABS: ORAL_TABLET | ORAL | 0 refills | Status: DC

## 2018-01-05 MED ORDER — NAPROXEN 500 MG PO TABS: 500.0000 mg | ORAL_TABLET | Freq: Two times a day (BID) | ORAL | 5 refills | Status: DC

## 2018-01-05 NOTE — Progress Notes (Signed)
Patient ZO:XWRUE YASMINA CHICO, female DOB:January 09, 1971, 47 y.o. [id number]  Chief Complaint  Patient presents with  . Follow-up    C SPINE    HPI  Cullen Judie Petit Delamar is a 47 y.o. female who has neck pain and paresthesias going to the left thumb and index finger.  She has numbness of the thumb and index finger more and more.  She has pain sleeping because of the numbness.  She has noticed some weakness of the left hand and arm.  She is getting worse.  I have tried twice to get permission from the insurance company for a MRI.  It has been denied twice.  I am very concerned that she is worse and now has weakness in addition to the numbness.   She has been on prednisone which helped only slightly. She is taking Naprosyn which is not helping.  She has been on pain medicine.  I will ask for peer to peer appeal on her insurance carrier. HPI  Body mass index is 31.31 kg/m.  ROS  Review of Systems  HENT: Negative for congestion.   Cardiovascular: Negative for chest pain and leg swelling.  Endocrine: Positive for cold intolerance.  Musculoskeletal: Positive for arthralgias, myalgias and neck pain.  Allergic/Immunologic: Positive for environmental allergies.  Neurological: Positive for headaches.  All other systems reviewed and are negative.   Past Medical History:  Diagnosis Date  . Chronic headaches   . Chronic neck pain    extending into right shoulder  . Chronic right shoulder pain   . DDD (degenerative disc disease), cervical     Past Surgical History:  Procedure Laterality Date  . BACK SURGERY      Family History  Problem Relation Age of Onset  . Fibromyalgia Mother   . Osteoarthritis Mother   . Diabetes Paternal Grandmother   . Hypertension Paternal Grandmother     Social History Social History   Tobacco Use  . Smoking status: Current Some Day Smoker    Packs/day: 0.50  . Smokeless tobacco: Never Used  Substance Use Topics  . Alcohol use: No  . Drug use: No     Allergies  Allergen Reactions  . Amoxicillin Nausea And Vomiting  . Tramadol     Current Outpatient Medications  Medication Sig Dispense Refill  . acetaminophen (TYLENOL) 500 MG tablet Take 1,000 mg by mouth every 8 (eight) hours as needed. Pain    . aspirin-acetaminophen-caffeine (EXCEDRIN MIGRAINE) 250-250-65 MG per tablet Take 2 tablets by mouth daily as needed for headache.    Marland Kitchen azithromycin (ZITHROMAX) 250 MG tablet Take 1 tablet (250 mg total) by mouth daily. For the next 4 days (start 01/19/2014) (Patient not taking: Reported on 11/03/2016) 4 tablet 0  . clarithromycin (BIAXIN) 500 MG tablet Take 1 tablet (500 mg total) by mouth 2 (two) times daily. (Patient not taking: Reported on 11/03/2016) 20 tablet 0  . HYDROcodone-acetaminophen (NORCO) 7.5-325 MG tablet One every four hours for pain as needed.  Do not drive car or operate machinery while taking this medicine.  Must last 14 days. 56 tablet 0  . naproxen (NAPROSYN) 500 MG tablet Take 1 tablet (500 mg total) by mouth 2 (two) times daily with a meal. 60 tablet 5  . ondansetron (ZOFRAN ODT) 8 MG disintegrating tablet Take 1 tablet (8 mg total) by mouth every 8 (eight) hours as needed for nausea or vomiting. (Patient not taking: Reported on 11/03/2016) 10 tablet 0  . predniSONE (STERAPRED UNI-PAK 21 TAB) 5 MG (  21) TBPK tablet Take 6 pills first day; 5 pills second day; 4 pills third day; 3 pills fourth day; 2 pills next day and 1 pill last day. 21 tablet 0  . promethazine (PHENERGAN) 25 MG tablet Take 1 tablet (25 mg total) by mouth every 6 (six) hours as needed for nausea or vomiting (or headache). (Patient not taking: Reported on 11/03/2016) 8 tablet 0   No current facility-administered medications for this visit.      Physical Exam  Blood pressure 95/63, pulse 68, height 5\' 6"  (1.676 m), weight 194 lb (88 kg).  Constitutional: overall normal hygiene, normal nutrition, well developed, normal grooming, normal body  habitus. Assistive device:none  Musculoskeletal: gait and station Limp none, muscle tone and strength are normal, no tremors or atrophy is present.  .  Neurological: coordination overall normal.  Deep tendon reflex/nerve stretch intact.  Sensation normal.  Cranial nerves II-XII intact.  She has weakness of triceps extension 4 of 5 on the left and numbness over the thumb and index fingers. Grip is weaker on the left.  ROM of the neck is full.  Skin:   Normal overall no scars, lesions, ulcers or rashes. No psoriasis.  Psychiatric: Alert and oriented x 3.  Recent memory intact, remote memory unclear.  Normal mood and affect. Well groomed.  Good eye contact.  Cardiovascular: overall no swelling, no varicosities, no edema bilaterally, normal temperatures of the legs and arms, no clubbing, cyanosis and good capillary refill.  Lymphatic: palpation is normal.  All other systems reviewed and are negative   The patient has been educated about the nature of the problem(s) and counseled on treatment options.  The patient appeared to understand what I have discussed and is in agreement with it.  Encounter Diagnoses  Name Primary?  . Neck pain Yes  . Cervicalgia     PLAN Call if any problems.  Precautions discussed.  Continue current medications.   Return to clinic after MRI of the neck   I have reviewed the [facility] Sub[facility] Reporting System web site prior to prescribing narcotic medicine for this patient.  Electronically Signed Darreld McleanWayne Konner Saiz, MD 2/7/20199:00 AM

## 2018-01-05 NOTE — Patient Instructions (Signed)
Steps to Quit Smoking Smoking tobacco can be bad for your health. It can also affect almost every organ in your body. Smoking puts you and people around you at risk for many serious Talor Desrosiers-lasting (chronic) diseases. Quitting smoking is hard, but it is one of the best things that you can do for your health. It is never too late to quit. What are the benefits of quitting smoking? When you quit smoking, you lower your risk for getting serious diseases and conditions. They can include:  Lung cancer or lung disease.  Heart disease.  Stroke.  Heart attack.  Not being able to have children (infertility).  Weak bones (osteoporosis) and broken bones (fractures).  If you have coughing, wheezing, and shortness of breath, those symptoms may get better when you quit. You may also get sick less often. If you are pregnant, quitting smoking can help to lower your chances of having a baby of low birth weight. What can I do to help me quit smoking? Talk with your doctor about what can help you quit smoking. Some things you can do (strategies) include:  Quitting smoking totally, instead of slowly cutting back how much you smoke over a period of time.  Going to in-person counseling. You are more likely to quit if you go to many counseling sessions.  Using resources and support systems, such as: ? Online chats with a counselor. ? Phone quitlines. ? Printed self-help materials. ? Support groups or group counseling. ? Text messaging programs. ? Mobile phone apps or applications.  Taking medicines. Some of these medicines may have nicotine in them. If you are pregnant or breastfeeding, do not take any medicines to quit smoking unless your doctor says it is okay. Talk with your doctor about counseling or other things that can help you.  Talk with your doctor about using more than one strategy at the same time, such as taking medicines while you are also going to in-person counseling. This can help make  quitting easier. What things can I do to make it easier to quit? Quitting smoking might feel very hard at first, but there is a lot that you can do to make it easier. Take these steps:  Talk to your family and friends. Ask them to support and encourage you.  Call phone quitlines, reach out to support groups, or work with a counselor.  Ask people who smoke to not smoke around you.  Avoid places that make you want (trigger) to smoke, such as: ? Bars. ? Parties. ? Smoke-break areas at work.  Spend time with people who do not smoke.  Lower the stress in your life. Stress can make you want to smoke. Try these things to help your stress: ? Getting regular exercise. ? Deep-breathing exercises. ? Yoga. ? Meditating. ? Doing a body scan. To do this, close your eyes, focus on one area of your body at a time from head to toe, and notice which parts of your body are tense. Try to relax the muscles in those areas.  Download or buy apps on your mobile phone or tablet that can help you stick to your quit plan. There are many free apps, such as QuitGuide from the CDC (Centers for Disease Control and Prevention). You can find more support from smokefree.gov and other websites.  This information is not intended to replace advice given to you by your health care provider. Make sure you discuss any questions you have with your health care provider. Document Released: 09/11/2009 Document   Revised: 07/13/2016 Document Reviewed: 04/01/2015 Elsevier Interactive Patient Education  2018 Elsevier Inc.  

## 2018-01-09 ENCOUNTER — Ambulatory Visit (HOSPITAL_COMMUNITY)
Admission: RE | Admit: 2018-01-09 | Discharge: 2018-01-09 | Disposition: A | Discharge status: Home / Self Care | Payer: Managed Care, Other (non HMO) | Source: Ambulatory Visit | Attending: Orthopaedic Surgery | Admitting: Orthopaedic Surgery

## 2018-01-09 DIAGNOSIS — M50223 Other cervical disc displacement at C6-C7 level: Secondary | ICD-10-CM | POA: Diagnosis not present

## 2018-01-09 DIAGNOSIS — M542 Cervicalgia: Secondary | ICD-10-CM | POA: Diagnosis present

## 2018-01-11 ENCOUNTER — Encounter: Payer: Self-pay | Admitting: Orthopaedic Surgery

## 2018-01-11 ENCOUNTER — Ambulatory Visit (INDEPENDENT_AMBULATORY_CARE_PROVIDER_SITE_OTHER): Payer: Managed Care, Other (non HMO) | Admitting: Orthopaedic Surgery

## 2018-01-11 VITALS — BP 105/44 | HR 68 | Temp 98.0°F | Ht 66.0 in | Wt 193.0 lb

## 2018-01-11 DIAGNOSIS — M542 Cervicalgia: Secondary | ICD-10-CM

## 2018-01-11 DIAGNOSIS — M50223 Other cervical disc displacement at C6-C7 level: Secondary | ICD-10-CM | POA: Diagnosis not present

## 2018-01-11 NOTE — Progress Notes (Signed)
Patient Kelly Nicholson, female DOB:01/03/1971, 47 y.o. [id number]  Chief Complaint  Patient presents with  . Neck Pain  . Results    review MRI     HPI  Kelly Nicholson is a 47 y.o. female who has paresthesias to the right hand thumb and index fingers. She is getting a little worse.  She has no trauma.  She had MRI of the neck which shows: IMPRESSION: 1. At C6-7 there is a central disc protrusion impressing on the thecal sac. 2. At C5-6 there is a mild broad-based disc bulge with a small right paracentral disc protrusion.  I have explained the findings to her.  She needs to see a neurosurgeon.  She requests Dr. Wynetta Emery who did surgery on her mother.  I will arrange this.  HPI  Body mass index is 31.15 kg/m.  ROS  Review of Systems  HENT: Negative for congestion.   Cardiovascular: Negative for chest pain and leg swelling.  Endocrine: Positive for cold intolerance.  Musculoskeletal: Positive for arthralgias, myalgias and neck pain.  Allergic/Immunologic: Positive for environmental allergies.  Neurological: Positive for headaches.  All other systems reviewed and are negative.   Past Medical History:  Diagnosis Date  . Chronic headaches   . Chronic neck pain    extending into right shoulder  . Chronic right shoulder pain   . DDD (degenerative disc disease), cervical     Past Surgical History:  Procedure Laterality Date  . BACK SURGERY      Family History  Problem Relation Age of Onset  . Fibromyalgia Mother   . Osteoarthritis Mother   . Diabetes Paternal Grandmother   . Hypertension Paternal Grandmother     Social History Social History   Tobacco Use  . Smoking status: Current Some Day Smoker    Packs/day: 0.50  . Smokeless tobacco: Never Used  Substance Use Topics  . Alcohol use: No  . Drug use: No    Allergies  Allergen Reactions  . Amoxicillin Nausea And Vomiting  . Tramadol     Current Outpatient Medications  Medication Sig Dispense  Refill  . acetaminophen (TYLENOL) 500 MG tablet Take 1,000 mg by mouth every 8 (eight) hours as needed. Pain    . aspirin-acetaminophen-caffeine (EXCEDRIN MIGRAINE) 250-250-65 MG per tablet Take 2 tablets by mouth daily as needed for headache.    Marland Kitchen HYDROcodone-acetaminophen (NORCO) 7.5-325 MG tablet One every six hours for pain as needed.  Must last 14 days. 56 tablet 0  . naproxen (NAPROSYN) 500 MG tablet Take 1 tablet (500 mg total) by mouth 2 (two) times daily with a meal. 60 tablet 5  . predniSONE (STERAPRED UNI-PAK 21 TAB) 5 MG (21) TBPK tablet Take 6 pills first day; 5 pills second day; 4 pills third day; 3 pills fourth day; 2 pills next day and 1 pill last day. (Patient not taking: Reported on 01/11/2018) 21 tablet 0   No current facility-administered medications for this visit.      Physical Exam  Blood pressure (!) 105/44, pulse 68, temperature 98 F (36.7 C), height 5\' 6"  (1.676 m), weight 193 lb (87.5 kg).  Constitutional: overall normal hygiene, normal nutrition, well developed, normal grooming, normal body habitus. Assistive device:none  Musculoskeletal: gait and station Limp none, muscle tone and strength are normal, no tremors or atrophy is present.  .  Neurological: coordination overall normal.  Deep tendon reflex/nerve stretch intact.  Sensation normal.  Cranial nerves II-XII intact.   Skin:   Normal overall  no scars, lesions, ulcers or rashes. No psoriasis.  Psychiatric: Alert and oriented x 3.  Recent memory intact, remote memory unclear.  Normal mood and affect. Well groomed.  Good eye contact.  Cardiovascular: overall no swelling, no varicosities, no edema bilaterally, normal temperatures of the legs and arms, no clubbing, cyanosis and good capillary refill.  Lymphatic: palpation is normal.  All other systems reviewed and are negative   The patient has been educated about the nature of the problem(s) and counseled on treatment options.  The patient appeared to  understand what I have discussed and is in agreement with it.  Encounter Diagnoses  Name Primary?  . Herniated nucleus pulposus, C6-7 Yes  . Neck pain     PLAN Call if any problems.  Precautions discussed.  Continue current medications.   Return to clinic to neurosurgeon   Electronically Signed Darreld McleanWayne Alex Mcmanigal, MD 2/13/201910:58 AM

## 2018-02-01 ENCOUNTER — Other Ambulatory Visit: Payer: Self-pay | Admitting: Orthopaedic Surgery

## 2018-02-01 MED ORDER — HYDROCODONE-ACETAMINOPHEN 7.5-325 MG PO TABS: ORAL_TABLET | ORAL | 0 refills | Status: DC

## 2018-02-01 NOTE — Telephone Encounter (Signed)
Hydrocodone-Acetaminophen  7.5/325 mg  Qty 56 Tablets  One every six hours for pain as needed. Must last 14 days.   PATIENT USES [facility] [place]

## 2018-02-27 ENCOUNTER — Other Ambulatory Visit: Payer: Self-pay | Admitting: Orthopedic Surgery

## 2018-02-27 NOTE — Telephone Encounter (Signed)
Patient of Dr. Sanjuan DameKeeling's requests refill on Hydrocodone/Acetaminophen 7.5-325 mgs.  7.5-325  Mgs.   Qty  56  Sig: One every six hours for pain as needed. Must last 14 days.  Patient uses [facility]

## 2018-02-28 MED ORDER — HYDROCODONE-ACETAMINOPHEN 7.5-325 MG PO TABS: ORAL_TABLET | ORAL | 0 refills | Status: DC

## 2018-02-28 NOTE — Telephone Encounter (Signed)
Call her this is the last script from this office she has been referred to neurosurgery

## 2018-05-03 ENCOUNTER — Encounter: Payer: Self-pay | Admitting: Orthopaedic Surgery

## 2018-05-03 ENCOUNTER — Ambulatory Visit (INDEPENDENT_AMBULATORY_CARE_PROVIDER_SITE_OTHER): Payer: Managed Care, Other (non HMO) | Admitting: Orthopaedic Surgery

## 2018-05-03 VITALS — BP 119/75 | HR 61 | Ht 66.0 in | Wt 189.0 lb

## 2018-05-03 DIAGNOSIS — M542 Cervicalgia: Secondary | ICD-10-CM | POA: Diagnosis not present

## 2018-05-03 MED ORDER — HYDROCODONE-ACETAMINOPHEN 7.5-325 MG PO TABS: ORAL_TABLET | ORAL | 0 refills | Status: DC

## 2018-05-03 MED ORDER — CYCLOBENZAPRINE HCL 10 MG PO TABS: 10.0000 mg | ORAL_TABLET | Freq: Every day | ORAL | 0 refills | Status: DC

## 2018-05-03 NOTE — Progress Notes (Signed)
Patient Kelly Nicholson, female DOB:03-Dec-1970, 47 y.o. [id number]  Chief Complaint  Patient presents with  . Follow-up    Neck    HPI  Kelly Nicholson is a 47 y.o. female who has neck pain.  She went to the neurosurgeon and I have read the notes.  They have recommended epidural injection into the neck and EMGs of the upper extremities.  The patient did not want to do either at this time.  She and I discussed her condition and treatments to date.  She is tired of hurting.  I have discussed physical therapy with her.  Her muscles of the neck and upper back are tight and she does not relax fully.  I think therapy is a good alternative to the injections.  She will try the therapy. HPI  Body mass index is 30.51 kg/m.  ROS  Review of Systems  HENT: Negative for congestion.   Cardiovascular: Negative for chest pain and leg swelling.  Endocrine: Positive for cold intolerance.  Musculoskeletal: Positive for arthralgias, myalgias and neck pain.  Allergic/Immunologic: Positive for environmental allergies.  Neurological: Positive for headaches.  All other systems reviewed and are negative.   Past Medical History:  Diagnosis Date  . Chronic headaches   . Chronic neck pain    extending into right shoulder  . Chronic right shoulder pain   . DDD (degenerative disc disease), cervical     Past Surgical History:  Procedure Laterality Date  . BACK SURGERY      Family History  Problem Relation Age of Onset  . Fibromyalgia Mother   . Osteoarthritis Mother   . Diabetes Paternal Grandmother   . Hypertension Paternal Grandmother     Social History Social History   Tobacco Use  . Smoking status: Current Some Day Smoker    Packs/day: 0.50  . Smokeless tobacco: Never Used  Substance Use Topics  . Alcohol use: No  . Drug use: No    Allergies  Allergen Reactions  . Amoxicillin Nausea And Vomiting  . Tramadol     Current Outpatient Medications  Medication Sig Dispense Refill   . acetaminophen (TYLENOL) 500 MG tablet Take 1,000 mg by mouth every 8 (eight) hours as needed. Pain    . aspirin-acetaminophen-caffeine (EXCEDRIN MIGRAINE) 250-250-65 MG per tablet Take 2 tablets by mouth daily as needed for headache.    . cyclobenzaprine (FLEXERIL) 10 MG tablet Take 1 tablet (10 mg total) by mouth at bedtime. One tablet every night at bedtime as needed for spasm. 30 tablet 0  . HYDROcodone-acetaminophen (NORCO) 7.5-325 MG tablet One every six hours for pain as needed.  Must last 14 days. 56 tablet 0  . naproxen (NAPROSYN) 500 MG tablet Take 1 tablet (500 mg total) by mouth 2 (two) times daily with a meal. 60 tablet 5  . predniSONE (STERAPRED UNI-PAK 21 TAB) 5 MG (21) TBPK tablet Take 6 pills first day; 5 pills second day; 4 pills third day; 3 pills fourth day; 2 pills next day and 1 pill last day. (Patient not taking: Reported on 01/11/2018) 21 tablet 0   No current facility-administered medications for this visit.      Physical Exam  Blood pressure 119/75, pulse 61, height 5\' 6"  (1.676 m), weight 189 lb (85.7 kg).  Constitutional: overall normal hygiene, normal nutrition, well developed, normal grooming, normal body habitus. Assistive device:none  Musculoskeletal: gait and station Limp none, muscle tone and strength are normal, no tremors or atrophy is present.  Marland Kitchen  Neurological: coordination overall normal.  Deep tendon reflex/nerve stretch intact.  Sensation normal.  Cranial nerves II-XII intact.   Skin:   Normal overall no scars, lesions, ulcers or rashes. No psoriasis.  Psychiatric: Alert and oriented x 3.  Recent memory intact, remote memory unclear.  Normal mood and affect. Well groomed.  Good eye contact.  Cardiovascular: overall no swelling, no varicosities, no edema bilaterally, normal temperatures of the legs and arms, no clubbing, cyanosis and good capillary refill.  Lymphatic: palpation is normal.  She has pain of the left upper trapezius and tight  muscles but no spasm of the left side of the neck. NV intact. ROM is full but tender.  Grips normal.  All other systems reviewed and are negative   The patient has been educated about the nature of the problem(s) and counseled on treatment options.  The patient appeared to understand what I have discussed and is in agreement with it.  Encounter Diagnosis  Name Primary?  . Neck pain Yes    PLAN Call if any problems.  Precautions discussed.  Continue current medications. I will add Flexeril to take at night.    Return to clinic 3 weeks   Begin PT.  I have reviewed the [place] Controlled Substance Reporting System web site prior to prescribing narcotic medicine for this patient.  Electronically Signed Darreld McleanWayne Brent Noto, MD 6/5/20199:50 AM

## 2018-05-03 NOTE — Progress Notes (Signed)
th

## 2018-05-03 NOTE — Patient Instructions (Signed)
Steps to Quit Smoking Smoking tobacco can be bad for your health. It can also affect almost every organ in your body. Smoking puts you and people around you at risk for many serious Kelly Nicholson (chronic) diseases. Quitting smoking is hard, but it is one of the best things that you can do for your health. It is never too late to quit. What are the benefits of quitting smoking? When you quit smoking, you lower your risk for getting serious diseases and conditions. They can include:  Lung cancer or lung disease.  Heart disease.  Stroke.  Heart attack.  Not being able to have children (infertility).  Weak bones (osteoporosis) and broken bones (fractures).  If you have coughing, wheezing, and shortness of breath, those symptoms may get better when you quit. You may also get sick less often. If you are pregnant, quitting smoking can help to lower your chances of having a baby of low birth weight. What can I do to help me quit smoking? Talk with your doctor about what can help you quit smoking. Some things you can do (strategies) include:  Quitting smoking totally, instead of slowly cutting back how much you smoke over a period of time.  Going to in-person counseling. You are more likely to quit if you go to many counseling sessions.  Using resources and support systems, such as: ? Online chats with a counselor. ? Phone quitlines. ? Printed self-help materials. ? Support groups or group counseling. ? Text messaging programs. ? Mobile phone apps or applications.  Taking medicines. Some of these medicines may have nicotine in them. If you are pregnant or breastfeeding, do not take any medicines to quit smoking unless your doctor says it is okay. Talk with your doctor about counseling or other things that can help you.  Talk with your doctor about using more than one strategy at the same time, such as taking medicines while you are also going to in-person counseling. This can help make  quitting easier. What things can I do to make it easier to quit? Quitting smoking might feel very hard at first, but there is a lot that you can do to make it easier. Take these steps:  Talk to your family and friends. Ask them to support and encourage you.  Call phone quitlines, reach out to support groups, or work with a counselor.  Ask people who smoke to not smoke around you.  Avoid places that make you want (trigger) to smoke, such as: ? Bars. ? Parties. ? Smoke-break areas at work.  Spend time with people who do not smoke.  Lower the stress in your life. Stress can make you want to smoke. Try these things to help your stress: ? Getting regular exercise. ? Deep-breathing exercises. ? Yoga. ? Meditating. ? Doing a body scan. To do this, close your eyes, focus on one area of your body at a time from head to toe, and notice which parts of your body are tense. Try to relax the muscles in those areas.  Download or buy apps on your mobile phone or tablet that can help you stick to your quit plan. There are many free apps, such as QuitGuide from the CDC (Centers for Disease Control and Prevention). You can find more support from smokefree.gov and other websites.  This information is not intended to replace advice given to you by your health care provider. Make sure you discuss any questions you have with your health care provider. Document Released: 09/11/2009 Document   Revised: 07/13/2016 Document Reviewed: 04/01/2015 Elsevier Interactive Patient Education  2018 Elsevier Inc.  

## 2018-05-24 ENCOUNTER — Ambulatory Visit (INDEPENDENT_AMBULATORY_CARE_PROVIDER_SITE_OTHER): Payer: Managed Care, Other (non HMO) | Admitting: Orthopaedic Surgery

## 2018-05-24 ENCOUNTER — Encounter: Payer: Self-pay | Admitting: Orthopaedic Surgery

## 2018-05-24 VITALS — BP 114/81 | HR 74 | Temp 97.2°F | Ht 66.0 in | Wt 191.0 lb

## 2018-05-24 DIAGNOSIS — M542 Cervicalgia: Secondary | ICD-10-CM | POA: Diagnosis not present

## 2018-05-24 MED ORDER — HYDROCODONE-ACETAMINOPHEN 7.5-325 MG PO TABS: ORAL_TABLET | ORAL | 0 refills | Status: DC
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
## 2018-05-24 NOTE — Patient Instructions (Signed)
Steps to Quit Smoking Smoking tobacco can be bad for your health. It can also affect almost every organ in your body. Smoking puts you and people around you at risk for many serious long-lasting (chronic) diseases. Quitting smoking is hard, but it is one of the best things that you can do for your health. It is never too late to quit. What are the benefits of quitting smoking? When you quit smoking, you lower your risk for getting serious diseases and conditions. They can include:  Lung cancer or lung disease.  Heart disease.  Stroke.  Heart attack.  Not being able to have children (infertility).  Weak bones (osteoporosis) and broken bones (fractures).  If you have coughing, wheezing, and shortness of breath, those symptoms may get better when you quit. You may also get sick less often. If you are pregnant, quitting smoking can help to lower your chances of having a baby of low birth weight. What can I do to help me quit smoking? Talk with your doctor about what can help you quit smoking. Some things you can do (strategies) include:  Quitting smoking totally, instead of slowly cutting back how much you smoke over a period of time.  Going to in-person counseling. You are more likely to quit if you go to many counseling sessions.  Using resources and support systems, such as: ? Online chats with a counselor. ? Phone quitlines. ? Printed self-help materials. ? Support groups or group counseling. ? Text messaging programs. ? Mobile phone apps or applications.  Taking medicines. Some of these medicines may have nicotine in them. If you are pregnant or breastfeeding, do not take any medicines to quit smoking unless your doctor says it is okay. Talk with your doctor about counseling or other things that can help you.  Talk with your doctor about using more than one strategy at the same time, such as taking medicines while you are also going to in-person counseling. This can help make  quitting easier. What things can I do to make it easier to quit? Quitting smoking might feel very hard at first, but there is a lot that you can do to make it easier. Take these steps:  Talk to your family and friends. Ask them to support and encourage you.  Call phone quitlines, reach out to support groups, or work with a counselor.  Ask people who smoke to not smoke around you.  Avoid places that make you want (trigger) to smoke, such as: ? Bars. ? Parties. ? Smoke-break areas at work.  Spend time with people who do not smoke.  Lower the stress in your life. Stress can make you want to smoke. Try these things to help your stress: ? Getting regular exercise. ? Deep-breathing exercises. ? Yoga. ? Meditating. ? Doing a body scan. To do this, close your eyes, focus on one area of your body at a time from head to toe, and notice which parts of your body are tense. Try to relax the muscles in those areas.  Download or buy apps on your mobile phone or tablet that can help you stick to your quit plan. There are many free apps, such as QuitGuide from the CDC (Centers for Disease Control and Prevention). You can find more support from smokefree.gov and other websites.  This information is not intended to replace advice given to you by your health care provider. Make sure you discuss any questions you have with your health care provider. Document Released: 09/11/2009 Document   Revised: 07/13/2016 Document Reviewed: 04/01/2015 Elsevier Interactive Patient Education  2018 Elsevier Inc.  

## 2018-06-09 ENCOUNTER — Telehealth: Payer: Self-pay | Admitting: Orthopaedic Surgery

## 2018-06-09 NOTE — Telephone Encounter (Signed)
Patient requests refill on Hydrocodone/Acetaminophen 7.5-325 mgs.  Qty  56  Sig: One every six hours for pain as needed. Must last 14 days.  Patient states she uses [facility]

## 2018-06-13 MED ORDER — HYDROCODONE-ACETAMINOPHEN 7.5-325 MG PO TABS: ORAL_TABLET | ORAL | 0 refills | Status: DC

## 2018-06-27 ENCOUNTER — Encounter: Payer: Self-pay | Admitting: Orthopaedic Surgery

## 2018-06-27 ENCOUNTER — Ambulatory Visit: Payer: Managed Care, Other (non HMO) | Admitting: Orthopaedic Surgery

## 2018-06-28 ENCOUNTER — Telehealth: Payer: Self-pay | Admitting: Orthopaedic Surgery

## 2018-06-28 MED ORDER — HYDROCODONE-ACETAMINOPHEN 7.5-325 MG PO TABS: ORAL_TABLET | ORAL | 0 refills | Status: DC

## 2018-06-28 NOTE — Telephone Encounter (Signed)
Patient requests refill on Hydrocodone/Acetaminophen 7.5-325 mgs.   Qty  50   Sig: One every six hours for pain as needed. Must last 14 days.         Patient states she uses [facility]

## 2018-07-05 ENCOUNTER — Ambulatory Visit: Payer: Managed Care, Other (non HMO) | Admitting: Orthopaedic Surgery

## 2018-07-12 ENCOUNTER — Ambulatory Visit: Payer: Managed Care, Other (non HMO) | Admitting: Orthopaedic Surgery

## 2018-07-12 ENCOUNTER — Encounter: Payer: Self-pay | Admitting: Orthopaedic Surgery

## 2019-06-13 ENCOUNTER — Telehealth: Payer: Self-pay | Admitting: Obstetrics & Gynecology

## 2019-06-13 NOTE — Telephone Encounter (Signed)
Was not able to reach patient on any of her numbers listed to review restrictions.

## 2019-06-14 ENCOUNTER — Encounter: Payer: Managed Care, Other (non HMO) | Admitting: Obstetrics & Gynecology

## 2019-09-28 ENCOUNTER — Telehealth: Payer: Self-pay | Admitting: Obstetrics & Gynecology

## 2019-09-28 NOTE — Telephone Encounter (Signed)

## 2019-10-01 ENCOUNTER — Encounter: Payer: 59 | Admitting: Obstetrics & Gynecology

## 2020-04-22 ENCOUNTER — Emergency Department (HOSPITAL_COMMUNITY): Payer: PRIVATE HEALTH INSURANCE

## 2020-04-22 ENCOUNTER — Emergency Department (HOSPITAL_COMMUNITY)
Admission: EM | Admit: 2020-04-22 | Discharge: 2020-04-22 | Disposition: A | Discharge status: Home / Self Care | Payer: PRIVATE HEALTH INSURANCE | Attending: Emergency Medicine | Admitting: Emergency Medicine

## 2020-04-22 ENCOUNTER — Encounter (HOSPITAL_COMMUNITY): Payer: Self-pay | Admitting: *Deleted

## 2020-04-22 ENCOUNTER — Other Ambulatory Visit: Payer: Self-pay

## 2020-04-22 DIAGNOSIS — B349 Viral infection, unspecified: Secondary | ICD-10-CM | POA: Diagnosis not present

## 2020-04-22 DIAGNOSIS — R509 Fever, unspecified: Secondary | ICD-10-CM | POA: Diagnosis present

## 2020-04-22 DIAGNOSIS — Z20822 Contact with and (suspected) exposure to covid-19: Secondary | ICD-10-CM | POA: Diagnosis not present

## 2020-04-22 DIAGNOSIS — F17210 Nicotine dependence, cigarettes, uncomplicated: Secondary | ICD-10-CM | POA: Diagnosis not present

## 2020-04-22 LAB — POC SARS CORONAVIRUS 2 AG -  ED: SARS Coronavirus 2 Ag: NEGATIVE

## 2020-04-22 MED ORDER — CETIRIZINE-PSEUDOEPHEDRINE ER 5-120 MG PO TB12
1.0000 | ORAL_TABLET | Freq: Two times a day (BID) | ORAL | 0 refills | Status: DC
Start: 2020-04-22 — End: 2020-11-04

## 2020-04-22 MED ORDER — BENZONATATE 100 MG PO CAPS
200.0000 mg | ORAL_CAPSULE | Freq: Three times a day (TID) | ORAL | 0 refills | Status: DC | PRN
Start: 2020-04-22 — End: 2020-11-04

## 2020-04-22 NOTE — Discharge Instructions (Addendum)
Your chest x-ray is clear and your rapid Covid test is negative, however we are sending out a confirmatory Covid test to ensure this rapid test was accurate.  This should be resulted within 24 hours.  In the interim you are advised to stay and to avoid close contact with others your confirmatory Covid test is resulted and is negative.  Rest to make sure you are drinking plenty of fluids.  You may take the medications prescribed to help you with your symptoms.  I also recommend ibuprofen for body aches and headache.  If your Covid test is positive you will need to maintain home isolation for 10 days from the date of onset of symptoms, longer if you have fever that lasts beyond this timeframe.  Follow-up with your primary doctor as needed, returning here for any increasing shortness of breath or weakness.

## 2020-04-22 NOTE — ED Triage Notes (Signed)
C/o body aches and fever onset 2 days ago

## 2020-04-23 LAB — SARS CORONAVIRUS 2 (TAT 6-24 HRS): SARS Coronavirus 2: NEGATIVE

## 2020-04-23 NOTE — ED Provider Notes (Signed)
[facility] EMERGENCY DEPARTMENT Provider Note   CSN: [id number] Arrival date & time: 04/22/20  1132     History Chief Complaint  Patient presents with  . Generalized Body Aches  . Fever    Kelly Nicholson is a 49 y.o. female presenting with a 2 day history of uri type symptoms which includes nasal congestion with clear rhinorrhea, body aches, fever of 100.8 2 days ago along with nonproductive cough.  Symptoms do not include shortness of breath, chest pain, nausea, vomiting or diarrhea. She has a mild sore throat but feels associated with post nasal drip. She has no significant sinus pain, does endorse generalized headache and reports loss of smell.  To her knowledge, no exposures to Covid 19.  She has had no tx prior to arrival.   The history is provided by the patient.       Past Medical History:  Diagnosis Date  . Chronic headaches   . Chronic neck pain    extending into right shoulder  . Chronic right shoulder pain   . DDD (degenerative disc disease), cervical     There are no problems to display for this patient.   Past Surgical History:  Procedure Laterality Date  . BACK SURGERY       OB History   No obstetric history on file.     Family History  Problem Relation Age of Onset  . Fibromyalgia Mother   . Osteoarthritis Mother   . Diabetes Paternal Grandmother   . Hypertension Paternal Grandmother     Social History   Tobacco Use  . Smoking status: Current Some Day Smoker    Packs/day: 0.50  . Smokeless tobacco: Never Used  Substance Use Topics  . Alcohol use: No  . Drug use: No    Home Medications Prior to Admission medications   Medication Sig Start Date End Date Taking? Authorizing Provider  acetaminophen (TYLENOL) 500 MG tablet Take 1,000 mg by mouth every 8 (eight) hours as needed. Pain    [provider]  aspirin-acetaminophen-caffeine (EXCEDRIN MIGRAINE) [phone] MG per tablet Take 2 tablets by mouth daily as needed for headache.     [provider]  benzonatate (TESSALON) 100 MG capsule Take 2 capsules (200 mg total) by mouth 3 (three) times daily as needed. 04/22/20   Burgess Amor, PA-C  cetirizine-pseudoephedrine (ZYRTEC-D) 5-120 MG tablet Take 1 tablet by mouth 2 (two) times daily. 04/22/20   Burgess Amor, PA-C  cyclobenzaprine (FLEXERIL) 10 MG tablet Take 1 tablet (10 mg total) by mouth at bedtime. One tablet every night at bedtime as needed for spasm. 05/03/18   Darreld Mclean, MD  HYDROcodone-acetaminophen (NORCO) 7.5-325 MG tablet One every six hours for pain as needed.  Must last 14 days. 06/28/18   Darreld Mclean, MD  naproxen (NAPROSYN) 500 MG tablet Take 1 tablet (500 mg total) by mouth 2 (two) times daily with a meal. 01/05/18   Darreld Mclean, MD    Allergies    Amoxicillin and Tramadol  Review of Systems   Review of Systems  Constitutional: Positive for fever. Negative for chills.  HENT: Positive for congestion, postnasal drip, rhinorrhea and sore throat. Negative for ear pain, sinus pressure, sinus pain, trouble swallowing and voice change.   Eyes: Negative for discharge.  Respiratory: Positive for cough. Negative for shortness of breath, wheezing and stridor.   Cardiovascular: Negative for chest pain.  Gastrointestinal: Negative for abdominal pain, nausea and vomiting.  Genitourinary: Negative.     Physical Exam  Updated Vital Signs BP (!) 100/55   Pulse (!) 59   Resp 18   Ht 5' 6.5" (1.689 m)   Wt 90.7 kg   LMP 04/19/2020   SpO2 99%   BMI 31.80 kg/m   Physical Exam Constitutional:      Appearance: She is well-developed.  HENT:     Head: Normocephalic and atraumatic.     Right Ear: Tympanic membrane and ear canal normal.     Left Ear: Tympanic membrane and ear canal normal.     Nose: Mucosal edema, congestion and rhinorrhea present.     Mouth/Throat:     Pharynx: Uvula midline. No oropharyngeal exudate or posterior oropharyngeal erythema.     Tonsils: No tonsillar abscesses.  Eyes:       Conjunctiva/sclera: Conjunctivae normal.  Cardiovascular:     Rate and Rhythm: Normal rate.     Heart sounds: Normal heart sounds.  Pulmonary:     Effort: Pulmonary effort is normal. No respiratory distress.     Breath sounds: No wheezing or rales.     Comments: Brief rhonchi right base which resolved with deep inspiration. Abdominal:     Palpations: Abdomen is soft.     Tenderness: There is no abdominal tenderness.  Musculoskeletal:        General: Normal range of motion.  Skin:    General: Skin is warm and dry.     Findings: No rash.  Neurological:     Mental Status: She is alert and oriented to person, place, and time.     ED Results / Procedures / Treatments   Labs (all labs ordered are listed, but only abnormal results are displayed) Labs Reviewed  SARS CORONAVIRUS 2 (TAT 6-24 HRS)  POC SARS CORONAVIRUS 2 AG -  ED    EKG None  Radiology DG Chest Port 1 View  Result Date: 04/22/2020 CLINICAL DATA:  Body aches, fever and cough beginning 2 days ago. EXAM: PORTABLE CHEST 1 VIEW COMPARISON:  01/18/2014 FINDINGS: Cardiac silhouette is normal in size. No mediastinal or hilar masses. No evidence of adenopathy. Lungs are clear.  No pleural effusion or pneumothorax. Skeletal structures are grossly intact. IMPRESSION: No active disease. Electronically Signed   By: Amie Portland M.D.   On: 04/22/2020 13:32    Procedures Procedures (including critical care time)  Medications Ordered in ED Medications - No data to display  ED Course  I have reviewed the triage vital signs and the nursing notes.  Pertinent labs & imaging results that were available during my care of the patient were reviewed by me and considered in my medical decision making (see chart for details).    MDM Rules/Calculators/A&P                      Pt with viral uri sx, probably viral, cannot rule out Covid 19, rapid test negative, pending confirmatory testing.  Advised home supportive care, rest,  increased fluids, ibuprofen.  Prescribed tessalon and zyrtec d for sx relief.  Discussed home quarantine recs until confirmatory test is completed and negative.  Kelly Nicholson was evaluated in Emergency Department on 04/23/2020 for the symptoms described in the history of present illness. She was evaluated in the context of the global COVID-19 pandemic, which necessitated consideration that the patient might be at risk for infection with the SARS-CoV-2 virus that causes COVID-19. Institutional protocols and algorithms that pertain to the evaluation of patients at risk for COVID-19 are in a state  of rapid change based on information released by regulatory bodies including the CDC and federal and state organizations. These policies and algorithms were followed during the patient's care in the ED.  Final Clinical Impression(s) / ED Diagnoses Final diagnoses:  Viral syndrome  Suspected COVID-19 virus infection    Rx / DC Orders ED Discharge Orders         Ordered    benzonatate (TESSALON) 100 MG capsule  3 times daily PRN     04/22/20 1428    cetirizine-pseudoephedrine (ZYRTEC-D) 5-120 MG tablet  2 times daily     04/22/20 1428           Evalee Jefferson, PA-C 04/23/20 1421    Elnora Morrison, MD 04/24/20 [phone]

## 2020-11-04 ENCOUNTER — Ambulatory Visit (INDEPENDENT_AMBULATORY_CARE_PROVIDER_SITE_OTHER): Payer: PRIVATE HEALTH INSURANCE | Admitting: Adult Health

## 2020-11-04 ENCOUNTER — Other Ambulatory Visit: Payer: Self-pay

## 2020-11-04 ENCOUNTER — Encounter: Payer: Self-pay | Admitting: Adult Health

## 2020-11-04 ENCOUNTER — Other Ambulatory Visit (HOSPITAL_COMMUNITY)
Admission: RE | Admit: 2020-11-04 | Discharge: 2020-11-04 | Disposition: A | Discharge status: Home / Self Care | Payer: PRIVATE HEALTH INSURANCE | Source: Ambulatory Visit | Attending: Obstetrics & Gynecology | Admitting: Obstetrics & Gynecology

## 2020-11-04 VITALS — BP 113/76 | HR 64 | Ht 66.0 in | Wt 206.5 lb

## 2020-11-04 DIAGNOSIS — R635 Abnormal weight gain: Secondary | ICD-10-CM

## 2020-11-04 DIAGNOSIS — Z131 Encounter for screening for diabetes mellitus: Secondary | ICD-10-CM

## 2020-11-04 DIAGNOSIS — R232 Flushing: Secondary | ICD-10-CM | POA: Diagnosis not present

## 2020-11-04 DIAGNOSIS — N951 Menopausal and female climacteric states: Secondary | ICD-10-CM | POA: Diagnosis not present

## 2020-11-04 DIAGNOSIS — R4589 Other symptoms and signs involving emotional state: Secondary | ICD-10-CM

## 2020-11-04 DIAGNOSIS — Z1211 Encounter for screening for malignant neoplasm of colon: Secondary | ICD-10-CM | POA: Diagnosis not present

## 2020-11-04 DIAGNOSIS — Z01419 Encounter for gynecological examination (general) (routine) without abnormal findings: Secondary | ICD-10-CM | POA: Insufficient documentation

## 2020-11-04 DIAGNOSIS — R519 Headache, unspecified: Secondary | ICD-10-CM

## 2020-11-04 LAB — HEMOCCULT GUIAC POC 1CARD (OFFICE): Fecal Occult Blood, POC: NEGATIVE

## 2020-11-04 MED ORDER — CONJ ESTROG-MEDROXYPROGEST ACE 0.625-5 MG PO TABS: 1.0000 | ORAL_TABLET | Freq: Every day | ORAL | 3 refills | Status: DC

## 2020-11-04 NOTE — Progress Notes (Signed)
Patient ID: Kelly Nicholson, female   DOB: 06-Jun-1971, 49 y.o.   MRN: [id number] History of Present Illness: Kelly Nicholson is a 49 year old white female, married, G4P0013 in for well woman gyn exam and pap. She is complaining of hot flashes, weight gain, moody and irritable and frequent headaches. Periods are still regular. She works at [employer] in [place].  She is a new pt. PCP is [facility].    Current Medications, Allergies, Past Medical History, Past Surgical History, Family History and Social History were reviewed in [employer] record.     Review of Systems: Patient denies any hearing loss, fatigue, blurred vision, shortness of breath, chest pain, abdominal pain, problems with bowel movements, urination, or intercourse. No joint pain. See HPI for positives.    Physical Exam:BP 113/76 (BP Location: Left Arm, Patient Position: Sitting, Cuff Size: Normal)   Pulse 64   Ht 5\' 6"  (1.676 m)   Wt 206 lb 8 oz (93.7 kg)   LMP 10/08/2020 (Approximate)   BMI 33.33 kg/m  General:  Well developed, well nourished, no acute distress Skin:  Warm and dry Neck:  Midline trachea, normal thyroid, good ROM, no lymphadenopathy Lungs; Clear to auscultation bilaterally Breast:  No dominant palpable mass, retraction, or nipple discharge, has several moles Cardiovascular: Regular rate and rhythm Abdomen:  Soft, non tender, no hepatosplenomegaly Pelvic:  External genitalia is normal in appearance, no lesions.  The vagina is normal in appearance. Urethra has no lesions or masses. The cervix is bulbous,irregular at os and friable, she had cryo years ago. Pap with high risk HPV genotyping performed. Uterus is felt to be normal size, shape, and contour.  No adnexal masses or tenderness noted.Bladder is non tender, no masses felt. Rectal: Good sphincter tone, no polyps, + hemorrhoids felt.  Hemoccult negative. Extremities/musculoskeletal:  No swelling or varicosities noted, no clubbing or  cyanosis Psych:  No mood changes, alert and cooperative,seems happy AA is 0  Fall risk is low PHQ 9 score is 3  [facility] - 11/04/20 1020      Pregnancy Intention Screening   Does the patient want to become pregnant in the next year? No    Does the patient's partner want to become pregnant in the next year? No    Would the patient like to discuss contraceptive options today? No      Contraception Wrap Up   Current Method Female Sterilization    End Method Female Sterilization    Contraception Counseling Provided No         Pt gave verbal consent for exam without a chaperone.  Impression and Plan: 1. Encounter for routine gynecological examination with Papanicolaou smear of cervix Pap sent Physical in 1 year Pap in 3 if normal Mammogram now and yearly Check CBC,CMP,TSH and lipids Will talk when labs back   2. Encounter for screening fecal occult blood testing   3. Peri-menopause Discussed HRT risks and benefits or SSRI, she wants to try HRT, she is aware of blood clot risks Review handout on menopause and menopause and HRT Will rx Prempro Meds ordered this encounter  Medications  . estrogen, conjugated,-medroxyprogesterone (PREMPRO) 0.625-5 MG tablet    Sig: Take 1 tablet by mouth daily.    Dispense:  30 tablet    Refill:  3    Order Specific Question:   Supervising Provider    Answer:   14/07/21 H [[id number]]  Follow up in 8 weeks for ROS   4. Hot flashes  5. Moody  6. Frequent headaches  7. Weight gain  8. Screening for diabetes mellitus Check A1c

## 2020-11-04 NOTE — Patient Instructions (Signed)
Menopause Menopause is the normal time of life when menstrual periods stop completely. It is usually confirmed by 12 months without a menstrual period. The transition to menopause (perimenopause) most often happens between the ages of 45 and 55. During perimenopause, hormone levels change in your body, which can cause symptoms and affect your health. Menopause may increase your risk for:  Loss of bone (osteoporosis), which causes bone breaks (fractures).  Depression.  Hardening and narrowing of the arteries (atherosclerosis), which can cause heart attacks and strokes. What are the causes? This condition is usually caused by a natural change in hormone levels that happens as you get older. The condition may also be caused by surgery to remove both ovaries (bilateral oophorectomy). What increases the risk? This condition is more likely to start at an earlier age if you have certain medical conditions or treatments, including:  A tumor of the pituitary gland in the brain.  A disease that affects the ovaries and hormone production.  Radiation treatment for cancer.  Certain cancer treatments, such as chemotherapy or hormone (anti-estrogen) therapy.  Heavy smoking and excessive alcohol use.  Family history of early menopause. This condition is also more likely to develop earlier in women who are very thin. What are the signs or symptoms? Symptoms of this condition include:  Hot flashes.  Irregular menstrual periods.  Night sweats.  Changes in feelings about sex. This could be a decrease in sex drive or an increased comfort around your sexuality.  Vaginal dryness and thinning of the vaginal walls. This may cause painful intercourse.  Dryness of the skin and development of wrinkles.  Headaches.  Problems sleeping (insomnia).  Mood swings or irritability.  Memory problems.  Weight gain.  Hair growth on the face and chest.  Bladder infections or problems with urinating. How  is this diagnosed? This condition is diagnosed based on your medical history, a physical exam, your age, your menstrual history, and your symptoms. Hormone tests may also be done. How is this treated? In some cases, no treatment is needed. You and your health care provider should make a decision together about whether treatment is necessary. Treatment will be based on your individual condition and preferences. Treatment for this condition focuses on managing symptoms. Treatment may include:  Menopausal hormone therapy (MHT).  Medicines to treat specific symptoms or complications.  Acupuncture.  Vitamin or herbal supplements. Before starting treatment, make sure to let your health care provider know if you have a personal or family history of:  Heart disease.  Breast cancer.  Blood clots.  Diabetes.  Osteoporosis. Follow these instructions at home: Lifestyle  Do not use any products that contain nicotine or tobacco, such as cigarettes and e-cigarettes. If you need help quitting, ask your health care provider.  Get at least 30 minutes of physical activity on 5 or more days each week.  Avoid alcoholic and caffeinated beverages, as well as spicy foods. This may help prevent hot flashes.  Get 7-8 hours of sleep each night.  If you have hot flashes, try: ? Dressing in layers. ? Avoiding things that may trigger hot flashes, such as spicy food, warm places, or stress. ? Taking slow, deep breaths when a hot flash starts. ? Keeping a fan in your home and office.  Find ways to manage stress, such as deep breathing, meditation, or journaling.  Consider going to group therapy with other women who are having menopause symptoms. Ask your health care provider about recommended group therapy meetings. Eating and   drinking  Eat a healthy, balanced diet that contains whole grains, lean protein, low-fat dairy, and plenty of fruits and vegetables.  Your health care provider may recommend  adding more soy to your diet. Foods that contain soy include tofu, tempeh, and soy milk.  Eat plenty of foods that contain calcium and vitamin D for bone health. Items that are rich in calcium include low-fat milk, yogurt, beans, almonds, sardines, broccoli, and kale. Medicines  Take over-the-counter and prescription medicines only as told by your health care provider.  Talk with your health care provider before starting any herbal supplements. If prescribed, take vitamins and supplements as told by your health care provider. These may include: ? Calcium. Women age 51 and older should get 1,200 mg (milligrams) of calcium every day. ? Vitamin D. Women need 600-800 International Units of vitamin D each day. ? Vitamins B12 and B6. Aim for 50 micrograms of B12 and 1.5 mg of B6 each day. General instructions  Keep track of your menstrual periods, including: ? When they occur. ? How heavy they are and how long they last. ? How much time passes between periods.  Keep track of your symptoms, noting when they start, how often you have them, and how long they last.  Use vaginal lubricants or moisturizers to help with vaginal dryness and improve comfort during sex.  Keep all follow-up visits as told by your health care provider. This is important. This includes any group therapy or counseling. Contact a health care provider if:  You are still having menstrual periods after age 55.  You have pain during sex.  You have not had a period for 12 months and you develop vaginal bleeding. Get help right away if:  You have: ? Severe depression. ? Excessive vaginal bleeding. ? Pain when you urinate. ? A fast or irregular heart beat (palpitations). ? Severe headaches. ? Abdomen (abdominal) pain or severe indigestion.  You fell and you think you have a broken bone.  You develop leg or chest pain.  You develop vision problems.  You feel a lump in your breast. Summary  Menopause is the normal  time of life when menstrual periods stop completely. It is usually confirmed by 12 months without a menstrual period.  The transition to menopause (perimenopause) most often happens between the ages of 45 and 55.  Symptoms can be managed through medicines, lifestyle changes, and complementary therapies such as acupuncture.  Eat a balanced diet that is rich in nutrients to promote bone health and heart health and to manage symptoms during menopause. This information is not intended to replace advice given to you by your health care provider. Make sure you discuss any questions you have with your health care provider. Document Revised: 10/28/2017 Document Reviewed: 12/18/2016 Elsevier Patient Education  2020 Elsevier Inc. Menopause and Hormone Replacement Therapy Menopause is a normal time of life when menstrual periods stop completely and the ovaries stop producing the female hormones estrogen and progesterone. This lack of hormones can affect your health and cause undesirable symptoms. Hormone replacement therapy (HRT) can relieve some of those symptoms. What is hormone replacement therapy? HRT is the use of artificial (synthetic) hormones to replace hormones that your body has stopped producing because you have reached menopause. What are my options for HRT?  HRT may consist of the synthetic hormones estrogen and progestin, or it may consist of only estrogen (estrogen-only therapy). You and your health care provider will decide which form of HRT is best for   you. If you choose to be on HRT and you have a uterus, estrogen and progestin are usually prescribed. Estrogen-only therapy is used for women who do not have a uterus. Possible options for taking HRT include:  Pills.  Patches.  Gels.  Sprays.  Vaginal cream.  Vaginal rings.  Vaginal inserts. The amount of hormone(s) that you take and how long you take the hormone(s) varies according to your health. It is important to:  Begin  HRT with the lowest possible dosage.  Stop HRT as soon as your health care provider tells you to stop.  Work with your health care provider so that you feel informed and comfortable with your decisions. What are the benefits of HRT? HRT can reduce the frequency and severity of menopausal symptoms. Benefits of HRT vary according to the kind of symptoms that you have, how severe they are, and your overall health. HRT may help to improve the following symptoms of menopause:  Hot flashes and night sweats. These are sudden feelings of heat that spread over the face and body. The skin may turn red, like a blush. Night sweats are hot flashes that happen while you are sleeping or trying to sleep.  Bone loss (osteoporosis). The body loses calcium more quickly after menopause, causing the bones to become weaker. This can increase the risk for bone breaks (fractures).  Vaginal dryness. The lining of the vagina can become thin and dry, which can cause pain during sex or cause infection, burning, or itching.  Urinary tract infections.  Urinary incontinence. This is the inability to control when you pass urine.  Irritability.  Short-term memory problems. What are the risks of HRT? Risks of HRT vary depending on your individual health and medical history. Risks of HRT also depend on whether you receive both estrogen and progestin or you receive estrogen only. HRT may increase the risk of:  Spotting. This is when a small amount of blood leaks from the vagina unexpectedly.  Endometrial cancer. This cancer is in the lining of the uterus (endometrium).  Breast cancer.  Increased density of breast tissue. This can make it harder to find breast cancer on a breast X-ray (mammogram).  Stroke.  Heart disease.  Blood clots.  Gallbladder disease.  Liver disease. Risks of HRT can increase if you have any of the following conditions:  Endometrial cancer.  Liver disease.  Heart disease.  Breast  cancer.  History of blood clots.  History of stroke. Follow these instructions at home:  Take over-the-counter and prescription medicines only as told by your health care provider.  Get mammograms, pelvic exams, and medical checkups as often as told by your health care provider.  Have Pap tests done as often as told by your health care provider. A Pap test is sometimes called a Pap smear. It is a screening test that is used to check for signs of cancer of the cervix and vagina. A Pap test can also identify the presence of infection or precancerous changes. Pap tests may be done: ? Every 3 years, starting at age 21. ? Every 5 years, starting after age 30, in combination with testing for human papillomavirus (HPV). ? More often or less often depending on other medical conditions you have, your age, and other risk factors.  It is up to you to get the results of your Pap test. Ask your health care provider, or the department that is doing the test, when your results will be ready.  Keep all follow-up visits   as told by your health care provider. This is important. Contact a health care provider if you have:  Pain or swelling in your legs.  Shortness of breath.  Chest pain.  Lumps or changes in your breasts or armpits.  Slurred speech.  Pain, burning, or bleeding when you urinate.  Unusual vaginal bleeding.  Dizziness or headaches.  Weakness or numbness in any part of your arms or legs.  Pain in your abdomen. Summary  Menopause is a normal time of life when menstrual periods stop completely and the ovaries stop producing the female hormones estrogen and progesterone.  Hormone replacement therapy (HRT) can relieve some of the symptoms of menopause.  HRT can reduce the frequency and severity of menopausal symptoms.  Risks of HRT vary depending on your individual health and medical history. This information is not intended to replace advice given to you by your health care  provider. Make sure you discuss any questions you have with your health care provider. Document Revised: 07/18/2018 Document Reviewed: 07/18/2018 Elsevier Patient Education  2020 Elsevier Inc.  

## 2020-11-05 ENCOUNTER — Telehealth: Payer: Self-pay | Admitting: Adult Health

## 2020-11-05 LAB — COMPREHENSIVE METABOLIC PANEL
ALT: 11 IU/L (ref 0–32)
AST: 16 IU/L (ref 0–40)
Albumin/Globulin Ratio: 2 (ref 1.2–2.2)
Albumin: 4.3 g/dL (ref 3.8–4.8)
Alkaline Phosphatase: 91 IU/L (ref 44–121)
BUN/Creatinine Ratio: 12 (ref 9–23)
BUN: 9 mg/dL (ref 6–24)
Bilirubin Total: 0.3 mg/dL (ref 0.0–1.2)
CO2: 22 mmol/L (ref 20–29)
Calcium: 9.2 mg/dL (ref 8.7–10.2)
Chloride: 105 mmol/L (ref 96–106)
Creatinine, Ser: 0.78 mg/dL (ref 0.57–1.00)
GFR calc Af Amer: 105 mL/min/{1.73_m2} (ref >59)
GFR calc non Af Amer: 91 mL/min/{1.73_m2} (ref >59)
Globulin, Total: 2.2 g/dL (ref 1.5–4.5)
Glucose: 81 mg/dL (ref 65–99)
Potassium: 4.6 mmol/L (ref 3.5–5.2)
Sodium: 139 mmol/L (ref 134–144)
Total Protein: 6.5 g/dL (ref 6.0–8.5)

## 2020-11-05 LAB — LIPID PANEL
Chol/HDL Ratio: 4 ratio (ref 0.0–4.4)
Cholesterol, Total: 168 mg/dL (ref 100–199)
HDL: 42 mg/dL (ref >39)
LDL Chol Calc (NIH): 103 mg/dL — ABNORMAL HIGH (ref 0–99)
Triglycerides: 129 mg/dL (ref 0–149)
VLDL Cholesterol Cal: 23 mg/dL (ref 5–40)

## 2020-11-05 LAB — CBC
Hematocrit: 38.3 % (ref 34.0–46.6)
Hemoglobin: 12.7 g/dL (ref 11.1–15.9)
MCH: 30.5 pg (ref 26.6–33.0)
MCHC: 33.2 g/dL (ref 31.5–35.7)
MCV: 92 fL (ref 79–97)
Platelets: 315 10*3/uL (ref 150–450)
RBC: 4.16 x10E6/uL (ref 3.77–5.28)
RDW: 12.8 % (ref 11.7–15.4)
WBC: 11.2 10*3/uL — ABNORMAL HIGH (ref 3.4–10.8)

## 2020-11-05 LAB — HEMOGLOBIN A1C
Est. average glucose Bld gHb Est-mCnc: 105 mg/dL
Hgb A1c MFr Bld: 5.3 % (ref 4.8–5.6)

## 2020-11-05 LAB — TSH: TSH: 1.04 u[IU]/mL (ref 0.450–4.500)

## 2020-11-05 MED ORDER — VENLAFAXINE HCL ER 75 MG PO CP24
75.0000 mg | ORAL_CAPSULE | Freq: Every day | ORAL | 3 refills | Status: DC
Start: 2020-11-05 — End: 2020-12-30

## 2020-11-05 NOTE — Telephone Encounter (Signed)
Pt changed mind about HRt will rx effexor

## 2020-11-06 LAB — CYTOLOGY - PAP
Comment: NEGATIVE
Diagnosis: NEGATIVE
High risk HPV: NEGATIVE

## 2020-11-10 ENCOUNTER — Telehealth: Payer: Self-pay | Admitting: Adult Health

## 2020-11-10 NOTE — Telephone Encounter (Signed)
Left message to call me about labs

## 2020-11-12 ENCOUNTER — Telehealth: Payer: Self-pay | Admitting: Adult Health

## 2020-11-12 NOTE — Telephone Encounter (Signed)
Pt aware of pap and labs.

## 2020-12-30 ENCOUNTER — Other Ambulatory Visit: Payer: Self-pay

## 2020-12-30 ENCOUNTER — Ambulatory Visit (INDEPENDENT_AMBULATORY_CARE_PROVIDER_SITE_OTHER): Payer: PRIVATE HEALTH INSURANCE | Admitting: Adult Health

## 2020-12-30 ENCOUNTER — Encounter: Payer: Self-pay | Admitting: Adult Health

## 2020-12-30 VITALS — BP 117/68 | HR 63 | Ht 66.0 in | Wt 200.0 lb

## 2020-12-30 DIAGNOSIS — R232 Flushing: Secondary | ICD-10-CM

## 2020-12-30 DIAGNOSIS — N951 Menopausal and female climacteric states: Secondary | ICD-10-CM

## 2020-12-30 DIAGNOSIS — R4589 Other symptoms and signs involving emotional state: Secondary | ICD-10-CM

## 2020-12-30 MED ORDER — VENLAFAXINE HCL ER 75 MG PO CP24: 75.0000 mg | ORAL_CAPSULE | Freq: Every day | ORAL | 6 refills | Status: AC

## 2020-12-30 NOTE — Progress Notes (Signed)
  Subjective:     Patient ID: Kelly Nicholson, female   DOB: [birth year], 50 y.o.   MRN: [id number]  HPI Kelly Nicholson is a 50 year old white female, married, G4P0013, back in follow up on starting Effexor, and is doing much better, she wishes she had started it sooner. PCP is [facility].   Review of Systems Feels much better Hot flashes better Not has moody or irritable Not has many headaches Reviewed past medical,surgical, social and family history. Reviewed medications and allergies.     Objective:   Physical Exam BP 117/68 (BP Location: Right Arm, Patient Position: Sitting, Cuff Size: Normal)   Pulse 63   Ht 5\' 6"  (1.676 m)   Wt 200 lb (90.7 kg)   LMP 12/04/2020   BMI 32.28 kg/m    Skin warm and dry. Lungs: clear to ausculation bilaterally. 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 She has lost 6 1/2 lbs too and happy.  PHQ 9 score is 0  [facility] - 12/30/20 1038      Pregnancy Intention Screening   Does the patient want to become pregnant in the next year? No    Would the patient like to discuss contraceptive options today? No      Contraception Wrap Up   Current Method Female Sterilization    End Method Female Sterilization    Contraception Counseling Provided No          Assessment:     1. Peri-menopause Continue Effexor Meds ordered this encounter  Medications  . venlafaxine XR (EFFEXOR-XR) 75 MG 24 hr capsule    Sig: Take 1 capsule (75 mg total) by mouth daily.    Dispense:  30 capsule    Refill:  6    Order Specific Question:   Supervising Provider    Answer:   02/27/21, LUTHER H [[id number]]    2. Hot flashes   3. Moody    Plan:     Follow up in 6 months or sooner if needed

## 2020-12-31 ENCOUNTER — Telehealth: Payer: Self-pay | Admitting: *Deleted

## 2020-12-31 NOTE — Telephone Encounter (Signed)
Pt received moderna vaccines 1st dose on 12-10-2019, 2nd dose on 01-07-2020 and booster on 10-03-2020 at [employer] where she works at

## 2021-05-13 ENCOUNTER — Emergency Department (HOSPITAL_COMMUNITY)
Admission: EM | Admit: 2021-05-13 | Discharge: 2021-05-13 | Disposition: A | Discharge status: Home / Self Care | Payer: PRIVATE HEALTH INSURANCE | Attending: Emergency Medicine | Admitting: Emergency Medicine

## 2021-05-13 ENCOUNTER — Encounter (HOSPITAL_COMMUNITY): Payer: Self-pay | Admitting: Emergency Medicine

## 2021-05-13 ENCOUNTER — Other Ambulatory Visit: Payer: Self-pay

## 2021-05-13 ENCOUNTER — Emergency Department (HOSPITAL_COMMUNITY): Payer: PRIVATE HEALTH INSURANCE

## 2021-05-13 DIAGNOSIS — Z7982 Long term (current) use of aspirin: Secondary | ICD-10-CM | POA: Diagnosis not present

## 2021-05-13 DIAGNOSIS — Z87891 Personal history of nicotine dependence: Secondary | ICD-10-CM | POA: Insufficient documentation

## 2021-05-13 DIAGNOSIS — J209 Acute bronchitis, unspecified: Secondary | ICD-10-CM | POA: Diagnosis not present

## 2021-05-13 DIAGNOSIS — R059 Cough, unspecified: Secondary | ICD-10-CM | POA: Diagnosis present

## 2021-05-13 DIAGNOSIS — Z20822 Contact with and (suspected) exposure to covid-19: Secondary | ICD-10-CM | POA: Insufficient documentation

## 2021-05-13 LAB — RESP PANEL BY RT-PCR (FLU A&B, COVID) ARPGX2
Influenza A by PCR: NEGATIVE
Influenza B by PCR: NEGATIVE
SARS Coronavirus 2 by RT PCR: NEGATIVE

## 2021-05-13 MED ORDER — KETOROLAC TROMETHAMINE 30 MG/ML IJ SOLN
30.0000 mg | Freq: Once | INTRAMUSCULAR | Status: AC
  Administered 2021-05-13: 30 mg via INTRAMUSCULAR
  Filled 2021-05-13: qty 1

## 2021-05-13 MED ORDER — AEROCHAMBER PLUS FLO-VU MEDIUM MISC
1.0000 | Freq: Once | Status: AC
  Administered 2021-05-13: 1

## 2021-05-13 MED ORDER — DOXYCYCLINE HYCLATE 100 MG PO CAPS
100.0000 mg | ORAL_CAPSULE | Freq: Two times a day (BID) | ORAL | 0 refills | Status: DC
Start: 2021-05-13 — End: 2021-08-11

## 2021-05-13 MED ORDER — HYDROCOD POLST-CPM POLST ER 10-8 MG/5ML PO SUER: 5.0000 mL | Freq: Two times a day (BID) | ORAL | 0 refills | Status: DC | PRN

## 2021-05-13 MED ORDER — DEXAMETHASONE SODIUM PHOSPHATE 10 MG/ML IJ SOLN
10.0000 mg | Freq: Once | INTRAMUSCULAR | Status: AC
  Administered 2021-05-13: 10 mg via INTRAMUSCULAR
  Filled 2021-05-13: qty 1

## 2021-05-13 MED ORDER — PREDNISONE 10 MG (21) PO TBPK
ORAL_TABLET | Freq: Every day | ORAL | 0 refills | Status: DC
Start: 2021-05-13 — End: 2021-08-11

## 2021-05-13 MED ORDER — ALBUTEROL SULFATE HFA 108 (90 BASE) MCG/ACT IN AERS
2.0000 | INHALATION_SPRAY | Freq: Once | RESPIRATORY_TRACT | Status: AC
  Administered 2021-05-13: 2 via RESPIRATORY_TRACT
  Filled 2021-05-13: qty 6.7

## 2021-05-13 NOTE — ED Notes (Signed)
EDP at bedside for MSE.  

## 2021-05-13 NOTE — ED Provider Notes (Signed)
[facility] EMERGENCY DEPARTMENT Provider Note   CSN: [id number] Arrival date & time: 05/13/21  2992     History Chief Complaint  Patient presents with   Cough    Kelly Nicholson is a 50 y.o. female.  Pt presents to the ED today with a cough and chills.  She said she has had sx since 6/10.  She went to [facility] on 6/13.  She was put on a Z-pack and tessalon perles.  She feels worse.  They did not check a Covid or do a CXR.  She has been unable to sleep and everything hurts from coughing so much.  She has been Covid vaccinated + booster.      Past Medical History:  Diagnosis Date   Chronic headaches    Chronic neck pain    extending into right shoulder   Chronic right shoulder pain    DDD (degenerative disc disease), cervical    Vaginal Pap smear, abnormal     Patient Active Problem List   Diagnosis Date Noted   Encounter for screening fecal occult blood testing 11/04/2020   Encounter for routine gynecological examination with Papanicolaou smear of cervix 11/04/2020   Frequent headaches 11/04/2020   Moody 11/04/2020   Hot flashes 11/04/2020   Peri-menopause 11/04/2020   Weight gain 11/04/2020    Past Surgical History:  Procedure Laterality Date   BACK SURGERY     esure     GYNECOLOGIC CRYOSURGERY       OB History     Gravida  4   Para  3   Term      Preterm      AB  1   Living  3      SAB  1   IAB      Ectopic      Multiple      Live Births              Family History  Problem Relation Age of Onset   Fibromyalgia Mother    Osteoarthritis Mother    Diabetes Paternal Grandmother    Hypertension Paternal Grandmother    Diabetes Brother     Social History   Tobacco Use   Smoking status: Former    Packs/day: 0.50    Pack years: 0.00    Types: Cigarettes   Smokeless tobacco: Never  Vaping Use   Vaping Use: Never used  Substance Use Topics   Alcohol use: No   Drug use: No    Home Medications Prior to Admission medications    Medication Sig Start Date End Date Taking? Authorizing Provider  chlorpheniramine-HYDROcodone (TUSSIONEX PENNKINETIC ER) 10-8 MG/5ML SUER Take 5 mLs by mouth every 12 (twelve) hours as needed for cough. 05/13/21  Yes Jacalyn Lefevre, MD  doxycycline (VIBRAMYCIN) 100 MG capsule Take 1 capsule (100 mg total) by mouth 2 (two) times daily. 05/13/21  Yes Jacalyn Lefevre, MD  predniSONE (STERAPRED UNI-PAK 21 TAB) 10 MG (21) TBPK tablet Take by mouth daily. Take 6 tabs by mouth daily  for 2 days, then 5 tabs for 2 days, then 4 tabs for 2 days, then 3 tabs for 2 days, 2 tabs for 2 days, then 1 tab by mouth daily for 2 days 05/13/21  Yes Jacalyn Lefevre, MD  acetaminophen (TYLENOL) 500 MG tablet Take 1,000 mg by mouth every 8 (eight) hours as needed. Pain Patient not taking: Reported on 12/30/2020    [provider]  aspirin-acetaminophen-caffeine (EXCEDRIN MIGRAINE) [phone] MG tablet  Take 2 tablets by mouth daily as needed for headache. Patient not taking: Reported on 12/30/2020    [provider]  venlafaxine XR (EFFEXOR-XR) 75 MG 24 hr capsule Take 1 capsule (75 mg total) by mouth daily. 12/30/20   Adline Potter, NP    Allergies    Amoxicillin and Tramadol  Review of Systems   Review of Systems  Constitutional:  Positive for chills and fatigue.  Respiratory:  Positive for cough.   All other systems reviewed and are negative.  Physical Exam Updated Vital Signs BP 113/65 (BP Location: Right Arm)   Pulse 66   Temp (!) 97.3 F (36.3 C) (Oral)   Resp 17   Ht 5\' 6"  (1.676 m)   Wt 90.7 kg   LMP 05/07/2021   SpO2 96%   BMI 32.28 kg/m   Physical Exam Vitals and nursing note reviewed.  Constitutional:      Appearance: Normal appearance.  HENT:     Head: Normocephalic and atraumatic.     Right Ear: External ear normal.     Left Ear: External ear normal.     Nose: Nose normal.     Mouth/Throat:     Mouth: Mucous membranes are moist.     Pharynx: Oropharynx is clear.   Eyes:     Extraocular Movements: Extraocular movements intact.     Conjunctiva/sclera: Conjunctivae normal.     Pupils: Pupils are equal, round, and reactive to light.  Cardiovascular:     Rate and Rhythm: Normal rate and regular rhythm.     Pulses: Normal pulses.     Heart sounds: Normal heart sounds.  Pulmonary:     Effort: Pulmonary effort is normal.     Breath sounds: Normal breath sounds.  Chest:     Comments: Chest wall pain to palpation Abdominal:     General: Abdomen is flat. Bowel sounds are normal.     Palpations: Abdomen is soft.  Musculoskeletal:        General: Normal range of motion.     Cervical back: Normal range of motion and neck supple.  Skin:    General: Skin is warm.     Capillary Refill: Capillary refill takes less than 2 seconds.  Neurological:     General: No focal deficit present.     Mental Status: She is alert and oriented to person, place, and time.  Psychiatric:        Mood and Affect: Mood normal.        Behavior: Behavior normal.        Thought Content: Thought content normal.        Judgment: Judgment normal.    ED Results / Procedures / Treatments   Labs (all labs ordered are listed, but only abnormal results are displayed) Labs Reviewed  RESP PANEL BY RT-PCR (FLU A&B, COVID) [id number]    EKG None  Radiology DG Chest Portable 1 View  Result Date: 05/13/2021 CLINICAL DATA:  Cough and chills for 1 week. EXAM: PORTABLE CHEST 1 VIEW COMPARISON:  Apr 22, 2020 FINDINGS: The heart size and mediastinal contours are within normal limits. Both lungs are clear. The visualized skeletal structures are unremarkable. IMPRESSION: No active disease. Electronically Signed   By: Apr 24, 2020 M.D.   On: 05/13/2021 10:23    Procedures Procedures   Medications Ordered in ED Medications  ketorolac (TORADOL) 30 MG/ML injection 30 mg (30 mg Intramuscular Given 05/13/21 1017)  dexamethasone (DECADRON) injection 10 mg (10 mg Intramuscular Given  05/13/21  1017)  albuterol (VENTOLIN HFA) 108 (90 Base) MCG/ACT inhaler 2 puff (2 puffs Inhalation Given 05/13/21 1016)  AeroChamber Plus Flo-Vu Medium MISC 1 each (1 each Other Given 05/13/21 1016)    ED Course  I have reviewed the triage vital signs and the nursing notes.  Pertinent labs & imaging results that were available during my care of the patient were reviewed by me and considered in my medical decision making (see chart for details).    MDM Rules/Calculators/A&P                         Covid/Flu neg.  Pt will be d/c with doxy/tussionex/prednisone.  Return if worse.  F/u with pcp.  Kelly Nicholson was evaluated in Emergency Department on 05/13/2021 for the symptoms described in the history of present illness. She was evaluated in the context of the global COVID-19 pandemic, which necessitated consideration that the patient might be at risk for infection with the SARS-CoV-2 virus that causes COVID-19. Institutional protocols and algorithms that pertain to the evaluation of patients at risk for COVID-19 are in a state of rapid change based on information released by regulatory bodies including the CDC and federal and state organizations. These policies and algorithms were followed during the patient's care in the ED.  Final Clinical Impression(s) / ED Diagnoses Final diagnoses:  Acute bronchitis, unspecified organism    Rx / DC Orders ED Discharge Orders          Ordered    predniSONE (STERAPRED UNI-PAK 21 TAB) 10 MG (21) TBPK tablet  Daily        05/13/21 1110    doxycycline (VIBRAMYCIN) 100 MG capsule  2 times daily        05/13/21 1110    chlorpheniramine-HYDROcodone (TUSSIONEX PENNKINETIC ER) 10-8 MG/5ML SUER  Every 12 hours PRN        05/13/21 1110             Jacalyn Lefevre, MD 05/13/21 1112

## 2021-05-13 NOTE — ED Triage Notes (Signed)
Pt reports she has had a cough, chills x 1 week; reports she was seen at [facility] and dx with bronchitis and RX for Z-pack; reports no relief; they did not do a chest xray or COVID test

## 2021-06-29 ENCOUNTER — Emergency Department (HOSPITAL_COMMUNITY): Payer: No Typology Code available for payment source

## 2021-06-29 ENCOUNTER — Other Ambulatory Visit: Payer: Self-pay

## 2021-06-29 ENCOUNTER — Emergency Department (HOSPITAL_COMMUNITY)
Admission: EM | Admit: 2021-06-29 | Discharge: 2021-06-29 | Disposition: A | Discharge status: Home / Self Care | Payer: No Typology Code available for payment source | Attending: Emergency Medicine | Admitting: Emergency Medicine

## 2021-06-29 ENCOUNTER — Encounter (HOSPITAL_COMMUNITY): Payer: Self-pay | Admitting: *Deleted

## 2021-06-29 DIAGNOSIS — Z87891 Personal history of nicotine dependence: Secondary | ICD-10-CM | POA: Diagnosis not present

## 2021-06-29 DIAGNOSIS — R1013 Epigastric pain: Secondary | ICD-10-CM | POA: Diagnosis not present

## 2021-06-29 DIAGNOSIS — R1010 Upper abdominal pain, unspecified: Secondary | ICD-10-CM | POA: Diagnosis present

## 2021-06-29 DIAGNOSIS — M546 Pain in thoracic spine: Secondary | ICD-10-CM | POA: Insufficient documentation

## 2021-06-29 DIAGNOSIS — R6883 Chills (without fever): Secondary | ICD-10-CM | POA: Diagnosis not present

## 2021-06-29 DIAGNOSIS — R110 Nausea: Secondary | ICD-10-CM | POA: Insufficient documentation

## 2021-06-29 LAB — CBC WITH DIFFERENTIAL/PLATELET
Abs Immature Granulocytes: 0.05 10*3/uL (ref 0.00–0.07)
Basophils Absolute: 0.1 10*3/uL (ref 0.0–0.1)
Basophils Relative: 1 %
Eosinophils Absolute: 0.1 10*3/uL (ref 0.0–0.5)
Eosinophils Relative: 1 %
HCT: 41.3 % (ref 36.0–46.0)
Hemoglobin: 13.3 g/dL (ref 12.0–15.0)
Immature Granulocytes: 1 %
Lymphocytes Relative: 30 %
Lymphs Abs: 2.7 10*3/uL (ref 0.7–4.0)
MCH: 31.4 pg (ref 26.0–34.0)
MCHC: 32.2 g/dL (ref 30.0–36.0)
MCV: 97.4 fL (ref 80.0–100.0)
Monocytes Absolute: 0.6 10*3/uL (ref 0.1–1.0)
Monocytes Relative: 6 %
Neutro Abs: 5.5 10*3/uL (ref 1.7–7.7)
Neutrophils Relative %: 61 %
Platelets: 322 10*3/uL (ref 150–400)
RBC: 4.24 MIL/uL (ref 3.87–5.11)
RDW: 13.6 % (ref 11.5–15.5)
WBC: 9 10*3/uL (ref 4.0–10.5)
nRBC: 0 % (ref 0.0–0.2)

## 2021-06-29 LAB — URINALYSIS, ROUTINE W REFLEX MICROSCOPIC
Bacteria, UA: NONE SEEN
Bilirubin Urine: NEGATIVE
Glucose, UA: NEGATIVE mg/dL
Ketones, ur: NEGATIVE mg/dL
Leukocytes,Ua: NEGATIVE
Nitrite: NEGATIVE
Protein, ur: NEGATIVE mg/dL
Specific Gravity, Urine: 1.02 (ref 1.005–1.030)
pH: 5 (ref 5.0–8.0)

## 2021-06-29 LAB — PREGNANCY, URINE: Preg Test, Ur: NEGATIVE

## 2021-06-29 LAB — COMPREHENSIVE METABOLIC PANEL
ALT: 18 U/L (ref 0–44)
AST: 19 U/L (ref 15–41)
Albumin: 3.8 g/dL (ref 3.5–5.0)
Alkaline Phosphatase: 69 U/L (ref 38–126)
Anion gap: 6 (ref 5–15)
BUN: 10 mg/dL (ref 6–20)
CO2: 28 mmol/L (ref 22–32)
Calcium: 8.7 mg/dL — ABNORMAL LOW (ref 8.9–10.3)
Chloride: 105 mmol/L (ref 98–111)
Creatinine, Ser: 0.75 mg/dL (ref 0.44–1.00)
GFR, Estimated: >60 mL/min (ref >60)
Glucose, Bld: 91 mg/dL (ref 70–99)
Potassium: 3.6 mmol/L (ref 3.5–5.1)
Sodium: 139 mmol/L (ref 135–145)
Total Bilirubin: 0.9 mg/dL (ref 0.3–1.2)
Total Protein: 7 g/dL (ref 6.5–8.1)

## 2021-06-29 LAB — LIPASE, BLOOD: Lipase: 24 U/L (ref 11–51)

## 2021-06-29 MED ORDER — ONDANSETRON 4 MG PO TBDP: 4.0000 mg | ORAL_TABLET | Freq: Three times a day (TID) | ORAL | 0 refills | Status: DC | PRN

## 2021-06-29 MED ORDER — PANTOPRAZOLE SODIUM 40 MG PO TBEC
40.0000 mg | DELAYED_RELEASE_TABLET | Freq: Every day | ORAL | 0 refills | Status: DC
Start: 2021-06-29 — End: 2021-08-11

## 2021-06-29 MED ORDER — IOHEXOL 300 MG/ML  SOLN
100.0000 mL | Freq: Once | INTRAMUSCULAR | Status: AC | PRN
  Administered 2021-06-29: 100 mL via INTRAVENOUS

## 2021-06-29 NOTE — Discharge Instructions (Signed)
The CT of your abdomen this evening shows a small stone in your gallbladder.  I have scheduled you to return for a ultrasound of your gallbladder.  You may call the scheduling department at [phone] tomorrow morning to schedule the appointment time.  I recommend that you stay on a bland diet and start the prescribed reflux medication as directed.  I have also provided follow-up information for local GI.  Return to the emergency department for any new or worsening symptoms.

## 2021-06-29 NOTE — ED Provider Notes (Signed)
[facility] EMERGENCY DEPARTMENT Provider Note   CSN: [id number] Arrival date & time: 06/29/21  1707     History Chief Complaint  Patient presents with   Abdominal Pain    Kelly Dwan BoltM Holle is a 50 y.o. female.   Abdominal Pain Associated symptoms: nausea   Associated symptoms: no chest pain, no chills, no cough, no diarrhea, no dysuria, no fever, no shortness of breath and no vomiting        Kelly Judie PetitM Katrinka Nicholson is a 50 y.o. female who presents to the Emergency Department complaining of upper abdominal pain since 3:00 AM.  She describes the pain as pressure and "grabbing" sensation to her upper abdomen and she felt a need to defecate.  She reports that her stool was brown, but consistency was loose and contained pink colored mucus.  Symptoms accompanied with nausea, but no vomiting, fever or chills.  No chest pain or shortness of breath.  She also complains the pain seems to radiate to her right mid back.    Past Medical History:  Diagnosis Date   Chronic headaches    Chronic neck pain    extending into right shoulder   Chronic right shoulder pain    DDD (degenerative disc disease), cervical    Vaginal Pap smear, abnormal     Patient Active Problem List   Diagnosis Date Noted   Encounter for screening fecal occult blood testing 11/04/2020   Encounter for routine gynecological examination with Papanicolaou smear of cervix 11/04/2020   Frequent headaches 11/04/2020   Moody 11/04/2020   Hot flashes 11/04/2020   Peri-menopause 11/04/2020   Weight gain 11/04/2020    Past Surgical History:  Procedure Laterality Date   BACK SURGERY     esure     GYNECOLOGIC CRYOSURGERY       OB History     Gravida  4   Para  3   Term      Preterm      AB  1   Living  3      SAB  1   IAB      Ectopic      Multiple      Live Births              Family History  Problem Relation Age of Onset   Fibromyalgia Mother    Osteoarthritis Mother    Diabetes Paternal  Grandmother    Hypertension Paternal Grandmother    Diabetes Brother     Social History   Tobacco Use   Smoking status: Former    Packs/day: 0.50    Types: Cigarettes   Smokeless tobacco: Never  Vaping Use   Vaping Use: Never used  Substance Use Topics   Alcohol use: No   Drug use: No    Home Medications Prior to Admission medications   Medication Sig Start Date End Date Taking? Authorizing Provider  acetaminophen (TYLENOL) 500 MG tablet Take 1,000 mg by mouth every 8 (eight) hours as needed. Pain Patient not taking: Reported on 12/30/2020    [provider]  aspirin-acetaminophen-caffeine (EXCEDRIN MIGRAINE) [phone] [id number] MG tablet Take 2 tablets by mouth daily as needed for headache. Patient not taking: Reported on 12/30/2020    [provider]  chlorpheniramine-HYDROcodone (TUSSIONEX PENNKINETIC ER) 10-8 MG/5ML SUER Take 5 mLs by mouth every 12 (twelve) hours as needed for cough. 05/13/21   Jacalyn LefevreHaviland, Julie, MD  doxycycline (VIBRAMYCIN) 100 MG capsule Take 1 capsule (100 mg total) by mouth 2 (two)  times daily. 05/13/21   Jacalyn Lefevre, MD  predniSONE (STERAPRED UNI-PAK 21 TAB) 10 MG (21) TBPK tablet Take by mouth daily. Take 6 tabs by mouth daily  for 2 days, then 5 tabs for 2 days, then 4 tabs for 2 days, then 3 tabs for 2 days, 2 tabs for 2 days, then 1 tab by mouth daily for 2 days 05/13/21   Jacalyn Lefevre, MD  venlafaxine XR (EFFEXOR-XR) 75 MG 24 hr capsule Take 1 capsule (75 mg total) by mouth daily. 12/30/20   Adline Potter, NP    Allergies    Amoxicillin and Tramadol  Review of Systems   Review of Systems  Constitutional:  Positive for appetite change. Negative for chills and fever.  HENT:  Negative for trouble swallowing.   Respiratory:  Negative for cough, chest tightness and shortness of breath.   Cardiovascular:  Negative for chest pain.  Gastrointestinal:  Positive for abdominal pain and nausea. Negative for diarrhea, rectal pain and vomiting.   Genitourinary:  Negative for dysuria.  Musculoskeletal:  Positive for back pain.  Neurological:  Negative for weakness and headaches.   Physical Exam Updated Vital Signs BP 97/62 (BP Location: Left Arm)   Pulse 63   Temp 98.2 F (36.8 C) (Oral)   Resp 16   Ht 5' 6.5" (1.689 m)   Wt 92.5 kg   SpO2 100%   BMI 32.43 kg/m   Physical Exam Vitals and nursing note reviewed.  Constitutional:      Appearance: Normal appearance. She is well-developed. She is not ill-appearing or toxic-appearing.  HENT:     Mouth/Throat:     Mouth: Mucous membranes are moist.  Cardiovascular:     Rate and Rhythm: Normal rate and regular rhythm.     Pulses: Normal pulses.  Pulmonary:     Effort: Pulmonary effort is normal.     Breath sounds: Normal breath sounds. No wheezing.  Abdominal:     Palpations: Abdomen is soft.     Tenderness: There is abdominal tenderness. There is no right CVA tenderness, left CVA tenderness, guarding or rebound.     Comments: Ttp of the epigastric area.Marland Kitchen  abd soft, no guarding or rebound   Musculoskeletal:        General: Normal range of motion.  Skin:    General: Skin is warm.     Capillary Refill: Capillary refill takes less than 2 seconds.     Findings: No rash.  Neurological:     General: No focal deficit present.     Mental Status: She is alert.     Sensory: No sensory deficit.     Motor: No weakness.    ED Results / Procedures / Treatments   Labs (all labs ordered are listed, but only abnormal results are displayed) Labs Reviewed  COMPREHENSIVE METABOLIC PANEL - Abnormal; Notable for the following components:      Result Value   Calcium 8.7 (*)    All other components within normal limits  URINALYSIS, ROUTINE W REFLEX MICROSCOPIC - Abnormal; Notable for the following components:   APPearance HAZY (*)    Hgb urine dipstick MODERATE (*)    All other components within normal limits  LIPASE, BLOOD  CBC WITH DIFFERENTIAL/PLATELET  PREGNANCY, URINE     EKG None  Radiology CT ABDOMEN PELVIS W CONTRAST  Result Date: 06/29/2021 CLINICAL DATA:  Onset of epigastric pain this morning with nausea and vomiting. EXAM: CT ABDOMEN AND PELVIS WITH CONTRAST TECHNIQUE: Multidetector CT imaging  of the abdomen and pelvis was performed using the standard protocol following bolus administration of intravenous contrast. CONTRAST:  OMNIPAQUE IOHEXOL 300 MG/ML  SOLN COMPARISON:  None. FINDINGS: Lower chest: 5 mm subpleural ground-glass nodule in the right lower lobe on image 3/4, likely a lymph node or focus of inflammation. No suspicious pulmonary nodularity. Hepatobiliary: The liver is normal in density without suspicious focal abnormality. Punctate calcified gallstone. No evidence of gallbladder wall thickening or biliary dilatation. Pancreas: Unremarkable. No pancreatic ductal dilatation or surrounding inflammatory changes. Spleen: Normal in size without focal abnormality. Adrenals/Urinary Tract: Both adrenal glands appear normal. The kidneys appear normal without evidence of urinary tract calculus, suspicious lesion or hydronephrosis. No bladder abnormalities are seen. Stomach/Bowel: No enteric contrast administered. The stomach appears unremarkable for its degree of distension. No evidence of bowel wall thickening, distention or surrounding inflammatory change. The appendix appears normal. There is mild fat deposition within the walls of the descending colon without surrounding inflammation. Vascular/Lymphatic: There are no enlarged abdominal or pelvic lymph nodes. Mild aortic and branch vessel atherosclerosis. No acute vascular findings. Reproductive: Essure IUD in place.  No suspicious adnexal findings. Other: No evidence of abdominal wall mass or hernia. No ascites. Musculoskeletal: No acute or significant osseous findings. Postsurgical and degenerative changes at L4-5. IMPRESSION: 1. No acute findings or explanation for the patient's symptoms. No evidence of  bowel obstruction, perforation or appendicitis. 2. Mild fat deposition within the descending colon without surrounding inflammation. 3. Cholelithiasis without evidence of cholecystitis or biliary dilatation. 4. Mild Aortic Atherosclerosis (ICD10-I70.0). Electronically Signed   By: Carey Bullocks M.D.   On: 06/29/2021 20:49    Procedures Procedures   Medications Ordered in ED Medications  iohexol (OMNIPAQUE) 300 MG/ML solution 100 mL (100 mLs Intravenous Contrast Given 06/29/21 2025)    ED Course  I have reviewed the triage vital signs and the nursing notes.  Pertinent labs & imaging results that were available during my care of the patient were reviewed by me and considered in my medical decision making (see chart for details).    MDM Rules/Calculators/A&P                           Patient here with epigastric pain of sudden onset at 3 AM this morning.  Pain was accompanied by nausea and chills.  Patient describes the pain as sharp.  No vomiting or fever.  No chest pain or shortness of breath.  Pain has somewhat subsided earlier today.  No pain at present.  No concerning symptoms for acute abdomen.  Labs show no leukocytosis.  No significant electrolyte derangement.  Liver functions unremarkable. Patient is not pregnant.  Lipase unremarkable.  Urinalysis shows moderate hemoglobin, history of same.  No dysuria symptoms.  Doubt UTI.  CT abdomen and pelvis with contrast shows a punctate gallstone.  I am doubtful this is the source of the patient's pain given the size of the stone, I feel that it would be beneficial for patient to have ultrasound of the gallbladder.  Ultrasound is unavailable after hours, so we will have patient return in the morning and start her on a PPI.  She is agreeable to this plan.  We will also provide follow-up information for GI.    Final Clinical Impression(s) / ED Diagnoses Final diagnoses:  Epigastric pain    Rx / DC Orders ED Discharge Orders           Ordered  US Abdomen Limited RUQ/Gall Gladder        06/29/21 2127             Pauline Aus, PA-C 06/30/21 1517    Long, Arlyss Repress, MD 07/01/21 [phone]

## 2021-06-29 NOTE — ED Notes (Signed)
This nurse attempted IV without success. Requested another nurse to attempt.

## 2021-06-29 NOTE — ED Notes (Signed)
Patient transported to CT 

## 2021-06-29 NOTE — ED Triage Notes (Signed)
Abdominal pain onset this am, also has nausea

## 2021-06-30 ENCOUNTER — Other Ambulatory Visit (HOSPITAL_COMMUNITY): Payer: Self-pay | Admitting: Family Medicine

## 2021-06-30 ENCOUNTER — Other Ambulatory Visit: Payer: Self-pay | Admitting: Family Medicine

## 2021-06-30 DIAGNOSIS — K8020 Calculus of gallbladder without cholecystitis without obstruction: Secondary | ICD-10-CM

## 2021-06-30 DIAGNOSIS — R1013 Epigastric pain: Secondary | ICD-10-CM

## 2021-07-09 ENCOUNTER — Other Ambulatory Visit: Payer: Self-pay

## 2021-07-09 ENCOUNTER — Ambulatory Visit (HOSPITAL_COMMUNITY)
Admission: RE | Admit: 2021-07-09 | Discharge: 2021-07-09 | Disposition: A | Discharge status: Home / Self Care | Payer: No Typology Code available for payment source | Source: Ambulatory Visit | Attending: Family Medicine | Admitting: Family Medicine

## 2021-07-09 DIAGNOSIS — R1013 Epigastric pain: Secondary | ICD-10-CM | POA: Insufficient documentation

## 2021-07-09 DIAGNOSIS — K8020 Calculus of gallbladder without cholecystitis without obstruction: Secondary | ICD-10-CM | POA: Insufficient documentation

## 2021-07-30 ENCOUNTER — Ambulatory Visit: Payer: No Typology Code available for payment source | Admitting: General Surgery

## 2021-08-11 ENCOUNTER — Ambulatory Visit: Payer: No Typology Code available for payment source | Admitting: General Surgery

## 2021-08-11 ENCOUNTER — Encounter: Payer: Self-pay | Admitting: General Surgery

## 2021-08-11 ENCOUNTER — Other Ambulatory Visit: Payer: Self-pay

## 2021-08-11 VITALS — BP 109/72 | HR 56 | Temp 97.9°F | Resp 14 | Ht 66.0 in | Wt 208.0 lb

## 2021-08-11 DIAGNOSIS — K8020 Calculus of gallbladder without cholecystitis without obstruction: Secondary | ICD-10-CM

## 2021-08-11 NOTE — Progress Notes (Signed)
[facility] History and Physical  Reason for Referral: Gallstones  Referring Physician:  ED / Karl Bales, NP   Chief Complaint   New Patient (Initial Visit)     Kelly Nicholson is a 50 y.o. female.  HPI: Ms. Arentz with gallstones on Ct and [place] after presenting to the ED with a grabbing epigastric pain that came on at 3AM one night and has not been present since. She says that she had some BBQ ribs the night before but eats these all the times without issues. She reports that she has not noticed pain with any fatty meals. She has some chronic GERD and some nausea/bloating at times with gas, but she never really has any pain. She says that her GERD pain is different from the pain she had that AM in the ED. Her mother had her gallbladder removed and had a bile leak complication, and it worries her to have to get her gallbladder removed if not needed.  Past Medical History:  Diagnosis Date   Chronic headaches    Chronic neck pain    extending into right shoulder   Chronic right shoulder pain    DDD (degenerative disc disease), cervical    Vaginal Pap smear, abnormal     Past Surgical History:  Procedure Laterality Date   BACK SURGERY     esure     GYNECOLOGIC CRYOSURGERY      Family History  Problem Relation Age of Onset   Fibromyalgia Mother    Osteoarthritis Mother    Diabetes Paternal Grandmother    Hypertension Paternal Grandmother    Diabetes Brother     Social History   Tobacco Use   Smoking status: Former    Packs/day: 0.50    Types: Cigarettes   Smokeless tobacco: Never  Vaping Use   Vaping Use: Never used  Substance Use Topics   Alcohol use: No   Drug use: No    Medications: I have reviewed the patient's current medications. Allergies as of 08/11/2021       Reactions   Amoxicillin Nausea And Vomiting   Tramadol Nausea Only        Medication List        Accurate as of August 11, 2021 10:17 AM. If you have any questions,  ask your nurse or doctor.          STOP taking these medications    acetaminophen 500 MG tablet Commonly known as: TYLENOL Stopped by: Lucretia Roers, MD   aspirin-acetaminophen-caffeine [phone] MG tablet Commonly known as: EXCEDRIN MIGRAINE Stopped by: Lucretia Roers, MD   chlorpheniramine-HYDROcodone 10-8 MG/5ML Suer Commonly known as: Tussionex Pennkinetic ER Stopped by: Lucretia Roers, MD   doxycycline 100 MG capsule Commonly known as: VIBRAMYCIN Stopped by: Lucretia Roers, MD   ondansetron 4 MG disintegrating tablet Commonly known as: Zofran ODT Stopped by: Lucretia Roers, MD   pantoprazole 40 MG tablet Commonly known as: PROTONIX Stopped by: Lucretia Roers, MD   predniSONE 10 MG (21) Tbpk tablet Commonly known as: STERAPRED UNI-PAK 21 TAB Stopped by: Lucretia Roers, MD       TAKE these medications    venlafaxine XR 75 MG 24 hr capsule Commonly known as: EFFEXOR-XR Take 1 capsule (75 mg total) by mouth daily.         ROS:  A comprehensive review of systems was negative except for: Gastrointestinal: positive for nausea and reflux symptoms Musculoskeletal: positive for neck pain  Blood pressure 109/72, pulse (!) 56, temperature 97.9 F (36.6 C), temperature source Other (Comment), resp. rate 14, height 5\' 6"  (1.676 m), weight 208 lb (94.3 kg), SpO2 98 %. Physical Exam  Results: CLINICAL DATA:  Abdominal pain.  Cholelithiasis.   EXAM: ULTRASOUND ABDOMEN LIMITED RIGHT UPPER QUADRANT   COMPARISON:  June 29, 2021.   FINDINGS: Gallbladder:   6 mm gallstone is noted. No gallbladder wall thickening or pericholecystic fluid is noted. No sonographic Murphy's sign is noted.   Common bile duct:   Diameter: 4 mm which is within normal limits.   Liver:   No focal lesion identified. Within normal limits in parenchymal echogenicity. Portal vein is patent on color Doppler imaging with normal direction of blood flow towards  the liver.   Other: None.   IMPRESSION: Cholelithiasis without evidence of cholecystitis.     Electronically Signed   By: July 01, 2021 M.D.   On: 07/10/2021 14:26   Assessment & Plan:  Kelly Nicholson is a 50 y.o. female with gallstones noted on imaging and an attack of some sort last month. She has not had any other issues and has chronic GERD. She does not take Aleve, BC powder or NSAIDs, discussed that this could have been from her gallbladder and she could monitor her diet or proceed with surgery. She is nervous because of the complications her mother had from her cholecystectomy with cholecystitis.   I counseled the patient about the indication, risks and benefits of laparoscopic cholecystectomy.  She understands there is a very small chance for bleeding, infection, injury to normal structures (including common bile duct), conversion to open surgery, persistent symptoms, evolution of postcholecystectomy diarrhea, need for secondary interventions, anesthesia reaction, cardiopulmonary issues and other risks not specifically detailed here. I described the expected recovery, the plan for follow-up and the restrictions during the recovery phase.  All questions were answered.  She will let 52 know when she wants to proceed.     [place] 08/11/2021, 10:17 AM

## 2021-08-11 NOTE — Patient Instructions (Signed)
Cholelithiasis Cholelithiasis happens when gallstones form in the gallbladder. The gallbladder stores bile. Bile is a fluid that helps digest fats. Bile can harden and form into gallstones. If they cause a blockage, they can cause pain (gallbladder attack). What are the causes? This condition may be caused by: Some blood diseases, such as sickle cell anemia. Too much of a fat-like substance (cholesterol) in your bile. Not enough bile salts in your bile. These salts help the body absorb and digest fats. The gallbladder not emptying fully or often enough. This is common in pregnant women. What increases the risk? The following factors may make you more likely to develop this condition: Being female. Being pregnant many times. Eating a lot of fried foods, fat, and refined carbs (refined carbohydrates). Being very overweight (obese). Being older than age 48. Using medicines with female hormones in them for a long time. Losing weight fast. Having gallstones in your family. Having some health problems, such as diabetes, Crohn's disease, or liver disease. What are the signs or symptoms? Often, there may be gallstones but no symptoms. These gallstones are called silent gallstones. If a gallstone causes a blockage, you may get sudden pain. The pain: Can be in the upper right part of your belly (abdomen). Normally comes at night or after you eat. Can last an hour or more. Can spread to your right shoulder, back, or chest. Can feel like discomfort, burning, or fullness in the upper part of your belly (indigestion). If the blockage lasts more than a few hours, you can get an infection or swelling. You may: Feel like you may vomit. Vomit. Feel bloated. Have belly pain for 5 hours or more. Feel tender in your belly, often in the upper right part and under your ribs. Have fever or chills. Have skin or the white parts of your eyes turn yellow (jaundice). Have dark pee (urine) or pale poop  (stool). How is this treated? Treatment for this condition depends on how bad you feel. If you have symptoms, you may need: Home care, if symptoms are not very bad. Do not eat for 12-24 hours. Drink only water and clear liquids. Start to eat simple or clear foods after 1 or 2 days. Try broths and crackers. You may need medicines for pain or stomach upset or both. If you have an infection, you will need antibiotics. A hospital stay, if you have very bad pain or a very bad infection. Surgery to remove your gallbladder. You may need this if: Gallstones keep coming back. You have very bad symptoms. Medicines to break up gallstones. Medicines: Are best for small gallstones. May be used for up to 6-12 months. A procedure to find and take out gallstones or to break up gallstones. Follow these instructions at home: Medicines Take over-the-counter and prescription medicines only as told by your doctor. If you were prescribed an antibiotic medicine, take it as told by your doctor. Do not stop taking the antibiotic even if you start to feel better. Ask your doctor if the medicine prescribed to you requires you to avoid driving or using machinery. Eating and drinking Drink enough fluid to keep your urine pale yellow. Drink water or clear fluids. This is important when you have pain. Eat healthy foods. Choose: Fewer fatty foods, such as fried foods. Fewer refined carbs. Avoid breads and grains that are highly processed, such as white bread and white rice. Choose whole grains, such as whole-wheat bread and brown rice. More fiber. Almonds, fresh fruit, and beans  beans are healthy sources. General instructions Keep a healthy weight. Keep all follow-up visits as told by your doctor. This is important. Where to find more information National Institute of Diabetes and Digestive and Kidney Diseases: www.niddk.nih.gov Contact a doctor if: You have sudden pain in the upper right part of your belly. Pain might  spread to your right shoulder, back, or chest. You have been diagnosed with gallstones that have no symptoms and you get: Belly pain. Discomfort, burning, or fullness in the upper part of your abdomen. You have dark urine or pale stools. Get help right away if: You have sudden pain in the upper right part of your abdomen, and the pain lasts more than 2 hours. You have pain in your abdomen, and: It lasts more than 5 hours. It keeps getting worse. You have a fever or chills. You keep feeling like you may vomit. You keep vomiting. Your skin or the white parts of your eyes turn yellow. Summary Cholelithiasis happens when gallstones form in the gallbladder. This condition may be caused by a blood disease, too much of a fat-like substance in the bile, or not enough bile salts in bile. Treatment for this condition depends on how bad you feel. If you have symptoms, do not eat or drink. You may need medicines. You may need a hospital stay for very bad pain or a very bad infection. You may need surgery if gallstones keep coming back or if you have very bad symptoms. This information is not intended to replace advice given to you by your health care provider. Make sure you discuss any questions you have with your healthcare provider. Document Revised: 01/04/2020 Document Reviewed: 10/08/2019 Elsevier Patient Education  2022 Else[employer] Inc.  Minimally Invasive Cholecystectomy Minimally invasive cholecystectomy is surgery to remove the gallbladder. The gallbladder is a pear-shaped organ that lies beneath the liver on the right side of the body. The gallbladder stores bile, which is a fluid that helps the body digest fats. Cholecystectomy is often done to treat inflammation of the gallbladder (cholecystitis). This condition is usually caused by a buildup of gallstones (cholelithiasis) in the gallbladder. Gallstones can block the flow of bile, which can resultin inflammation and pain. In severe cases,  emergency surgery may be required. This procedure is done though small incisions in the abdomen, instead of one large incision. It is also called laparoscopic surgery. A thin scope with a camera (laparoscope) is inserted through one incision. Then surgical instruments are inserted through the other incisions. In some cases, a minimally invasive surgery may need to be changed to a surgery that is done through a larger incision. This iscalled open surgery. Tell a health care provider about: Any allergies you have. All medicines you are taking, including vitamins, herbs, eye drops, creams, and over-the-counter medicines. Any problems you or family members have had with anesthetic medicines. Any blood disorders you have. Any surgeries you have had. Any medical conditions you have. Whether you are pregnant or may be pregnant. What are the risks? Generally, this is a safe procedure. However, problems may occur, including: Infection. Bleeding. Allergic reactions to medicines. Damage to nearby structures or organs. A stone remaining in the common bile duct. The common bile duct carries bile from the gallbladder into the small intestine. A bile leak from the cyst duct that is clipped when your gallbladder is removed. What happens before the procedure? Medicines Ask your health care provider about: Changing or stopping your regular medicines. This is especially important if you   are taking diabetes medicines or blood thinners. Taking medicines such as aspirin and ibuprofen. These medicines can thin your blood. Do not take these medicines unless your health care provider tells you to take them. Taking over-the-counter medicines, vitamins, herbs, and supplements. General instructions Let your health care provider know if you develop a cold or an infection before surgery. Plan to have someone take you home from the hospital or clinic. If you will be going home right after the procedure, plan to have  someone with you for 24 hours. Ask your health care provider: How your surgery site will be marked. What steps will be taken to help prevent infection. These may include: Removing hair at the surgery site. Washing skin with a germ-killing soap. Taking antibiotic medicine. What happens during the procedure?  An IV will be inserted into one of your veins. You will be given one or both of the following: A medicine to help you relax (sedative). A medicine to make you fall asleep (general anesthetic). A breathing tube will be placed in your mouth. Your surgeon will make several small incisions in your abdomen. The laparoscope will be inserted through one of the small incisions. The camera on the laparoscope will send images to a monitor in the operating room. This lets your surgeon see inside your abdomen. A gas will be pumped into your abdomen. This will expand your abdomen to give the surgeon more room to perform the surgery. Other tools that are needed for the procedure will be inserted through the other incisions. The gallbladder will be removed through one of the incisions. Your common bile duct may be examined. If stones are found in the common bile duct, they may be removed. After your gallbladder has been removed, the incisions will be closed with stitches (sutures), staples, or skin glue. Your incisions may be covered with a bandage (dressing). The procedure may vary among health care providers and hospitals. What happens after the procedure? Your blood pressure, heart rate, breathing rate, and blood oxygen level will be monitored until you leave the hospital or clinic. You will be given medicines as needed to control your pain. If you were given a sedative during the procedure, it can affect you for several hours. Do not drive or operate machinery until your health care provider says that it is safe. Summary Minimally invasive cholecystectomy, also called laparoscopic cholecystectomy,  is surgery to remove the gallbladder using small incisions. Tell your health care provider about all the medical conditions you have and all the medicines you are taking for those conditions. Before the procedure, follow instructions about eating or drinking restrictions and changing or stopping medicines. If you were given a sedative during the procedure, it can affect you for several hours. Do not drive or operate machinery until your health care provider says that it is safe. This information is not intended to replace advice given to you by your health care provider. Make sure you discuss any questions you have with your healthcare provider. Document Revised: 08/20/2019 Document Reviewed: 08/20/2019 Elsevier Patient Education  2022 Elsevier Inc.   

## 2021-08-18 ENCOUNTER — Ambulatory Visit: Payer: No Typology Code available for payment source | Admitting: General Surgery

## 2022-03-31 IMAGING — DX DG CHEST 1V PORT
1 series · 1 of 1 positions shown · non-contrast
Comparison: April 22, 2020

CLINICAL DATA: Cough and chills for 1 week.

EXAM:
PORTABLE CHEST 1 VIEW

[chest ap]
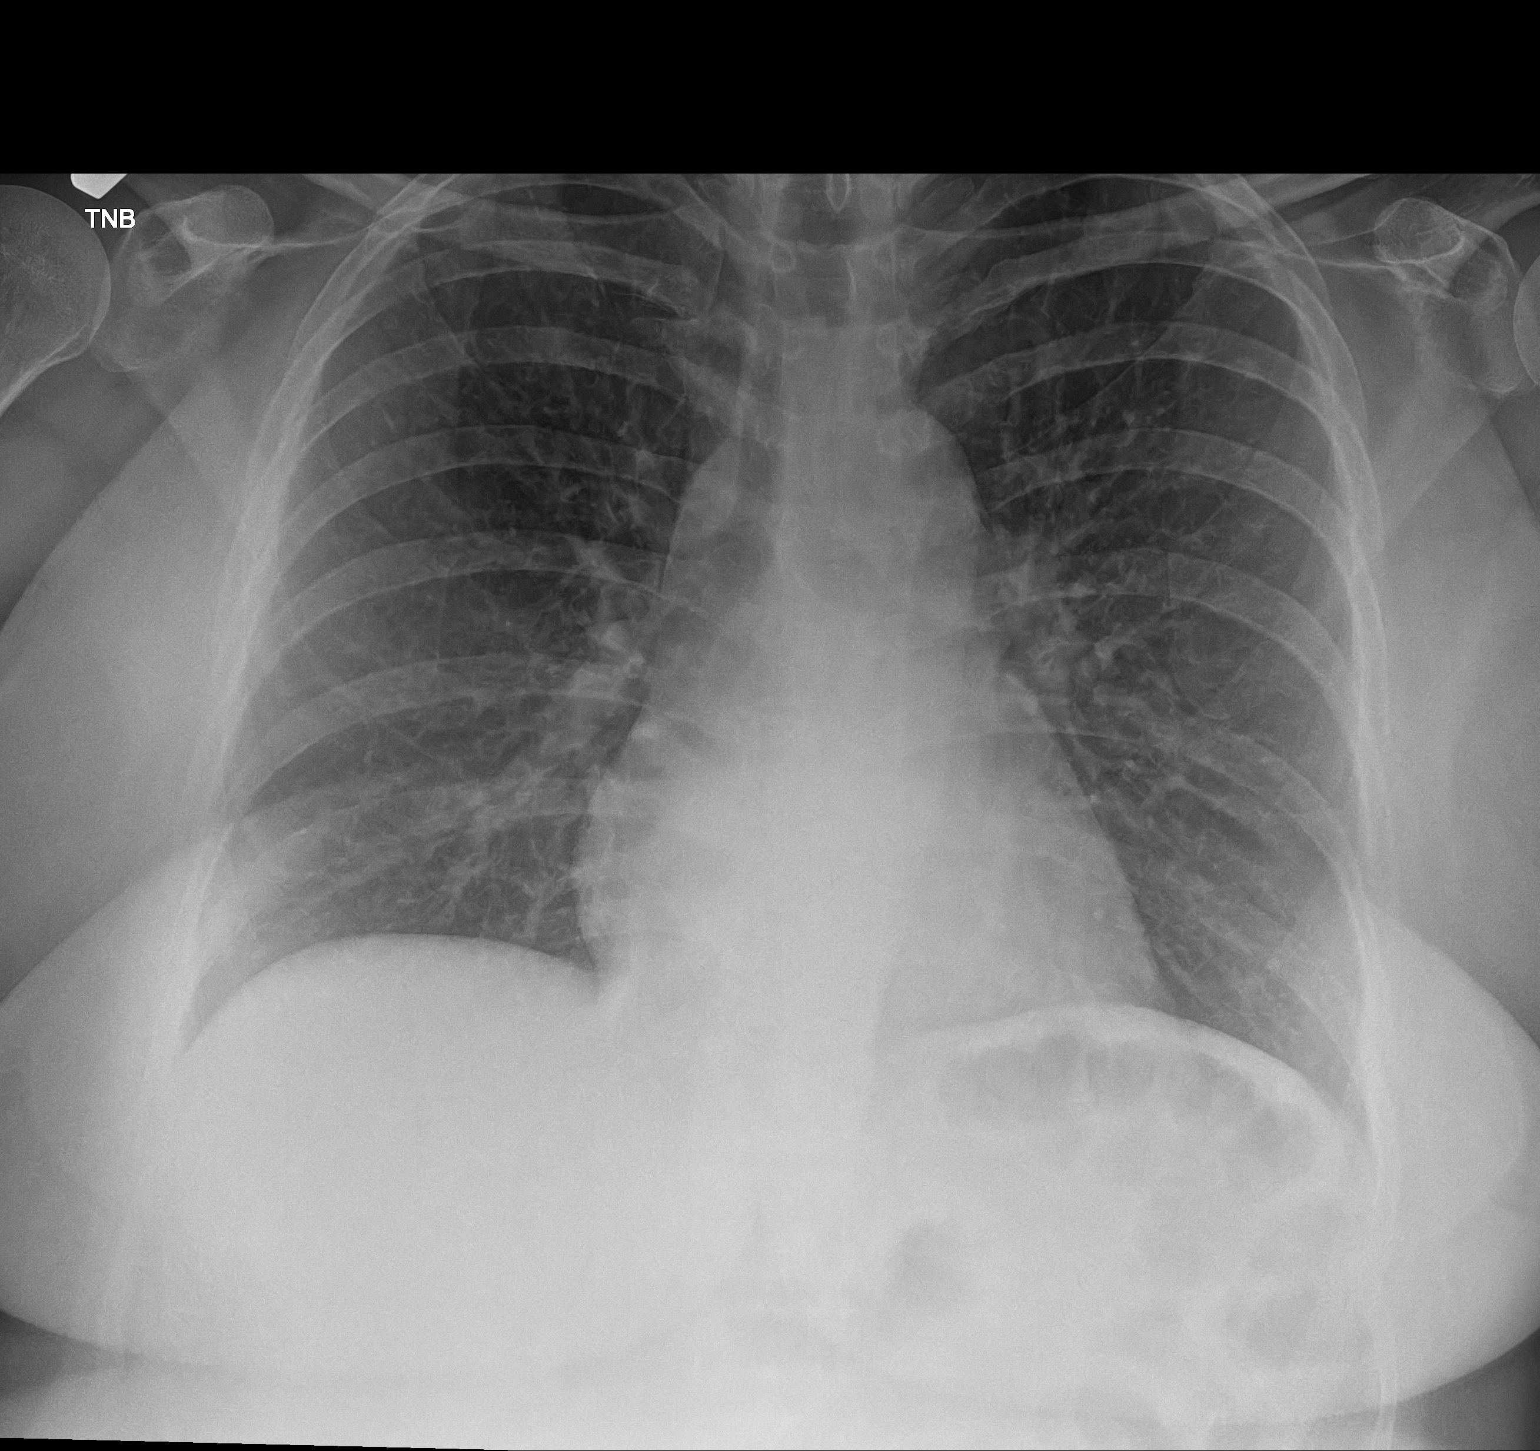

[1 of 1 positions shown; findings below may reference images not displayed]

FINDINGS: The heart size and mediastinal contours are within normal limits.
Both lungs are clear. The visualized skeletal structures are
unremarkable.
IMPRESSION: No active disease.

## 2022-05-17 IMAGING — CT CT ABD-PELV W/ CM
2 of 4 series · 16 of 46 positions shown, 18 images · IV contrast (Omnipaque or Isovue)
Comparison: None.

CLINICAL DATA: Onset of epigastric pain this morning with nausea
and vomiting.

EXAM:
CT ABDOMEN AND PELVIS WITH CONTRAST
TECHNIQUE: Multidetector CT imaging of the abdomen and pelvis was performed
using the standard protocol following bolus administration of
intravenous contrast.
CONTRAST:  100mL OMNIPAQUE IOHEXOL 300 MG/ML  SOLN

[Series 2: axial st · axial · 0.67mm/px · z∈[-170,+275]mm · 13 of 99 slices shown, 15 images]
[im 5/99  soft-tissue]
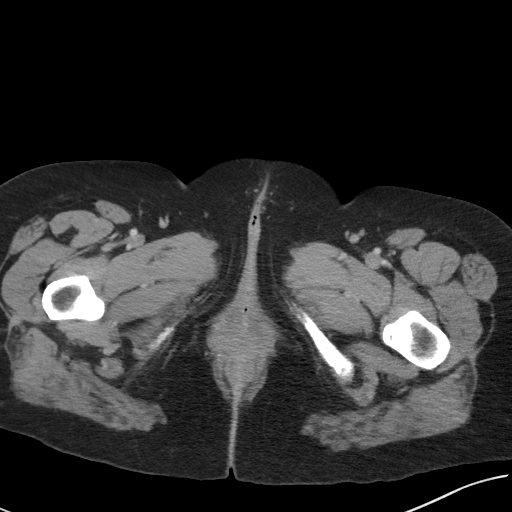
[im 5/99  bone]
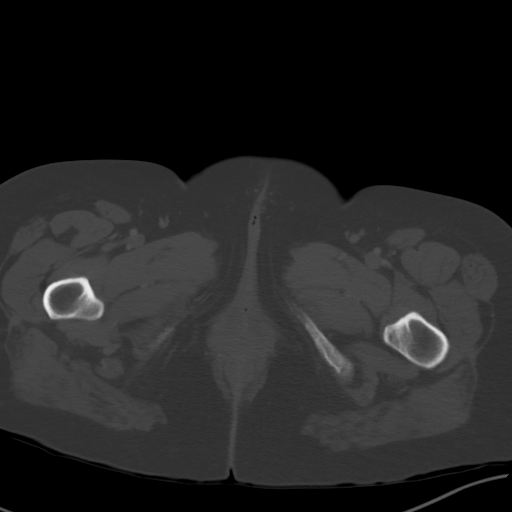
[im 14/99  soft-tissue]
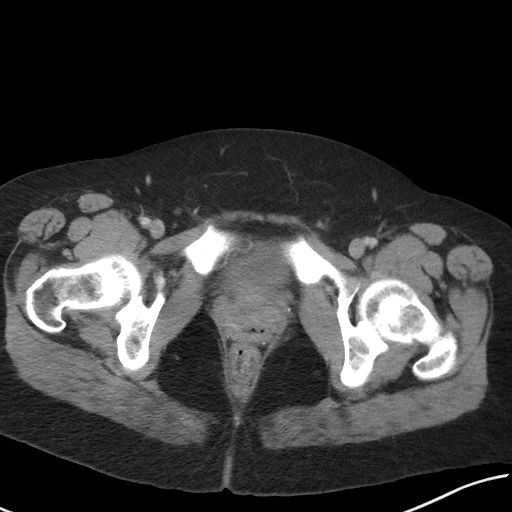
[im 23/99  soft-tissue]
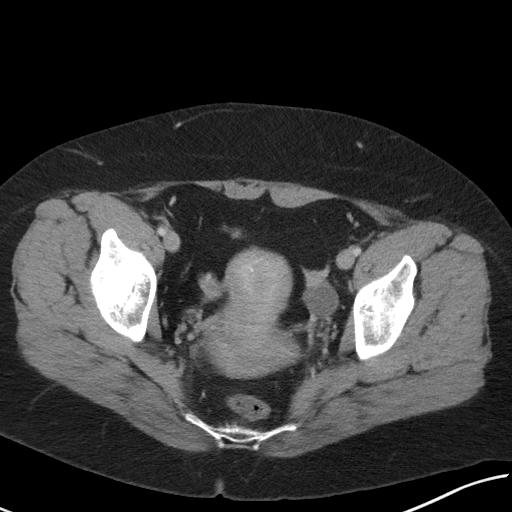
[im 27/99  soft-tissue]
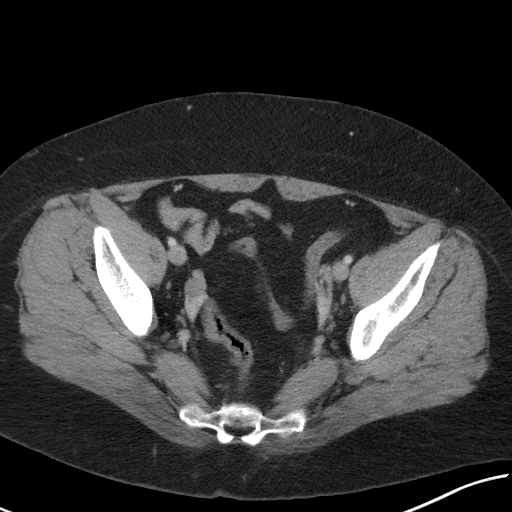
[im 36/99  soft-tissue]
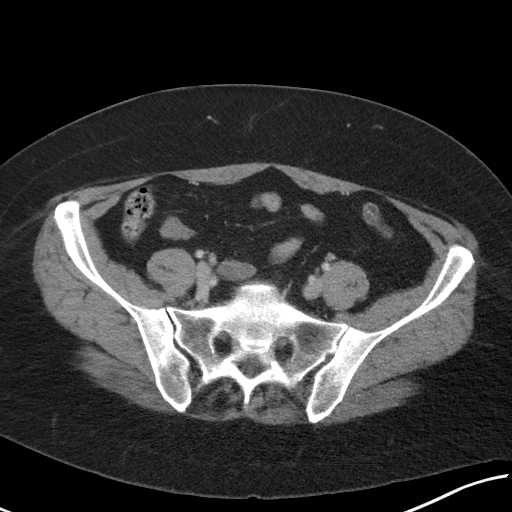
[im 41/99  soft-tissue]
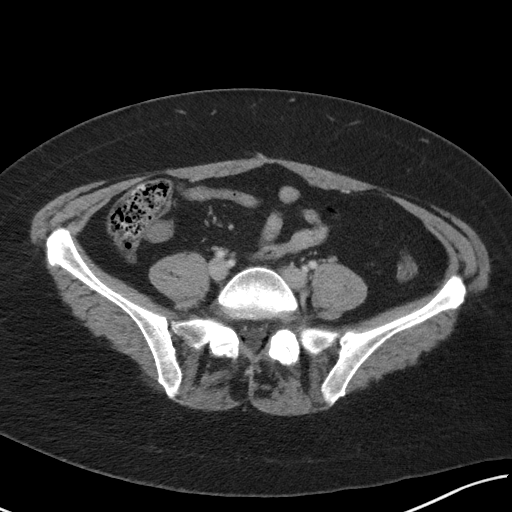
[im 49/99  soft-tissue]
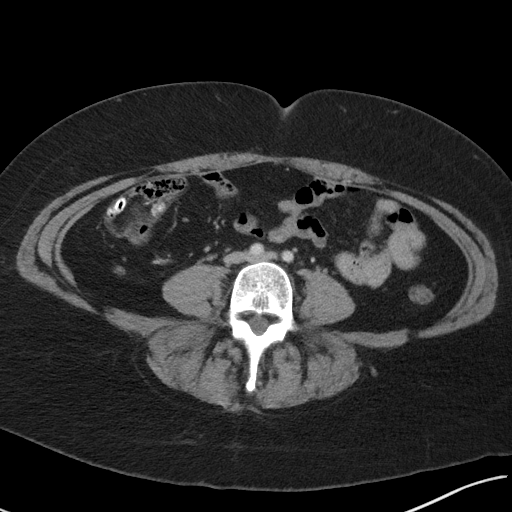
[im 58/99  soft-tissue]
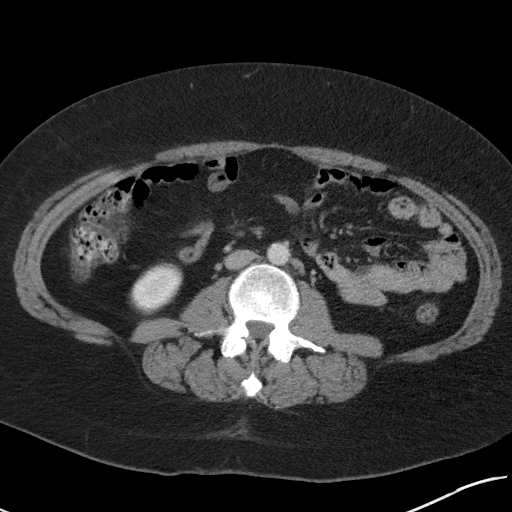
[im 63/99  soft-tissue]
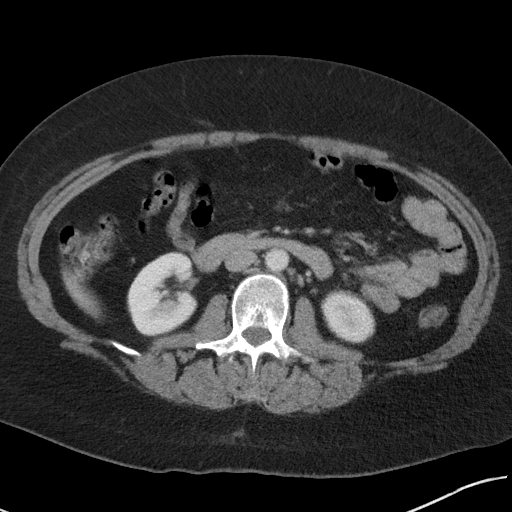
[im 63/99  bone]
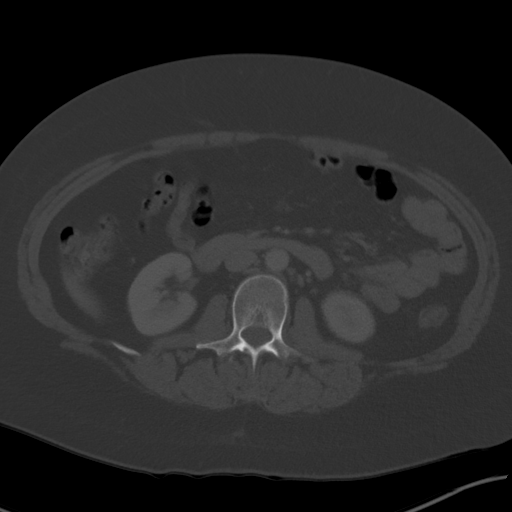
[im 72/99  soft-tissue]
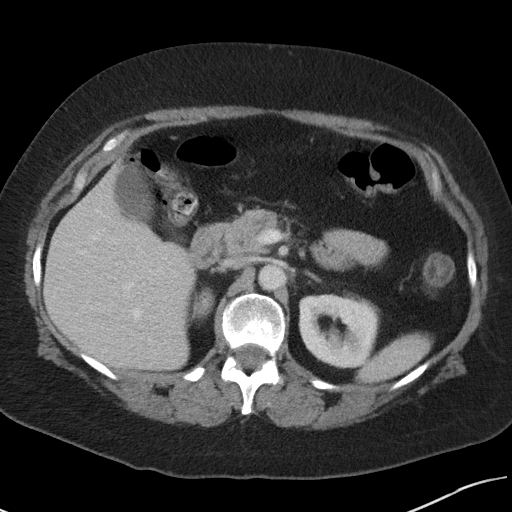
[im 76/99  soft-tissue]
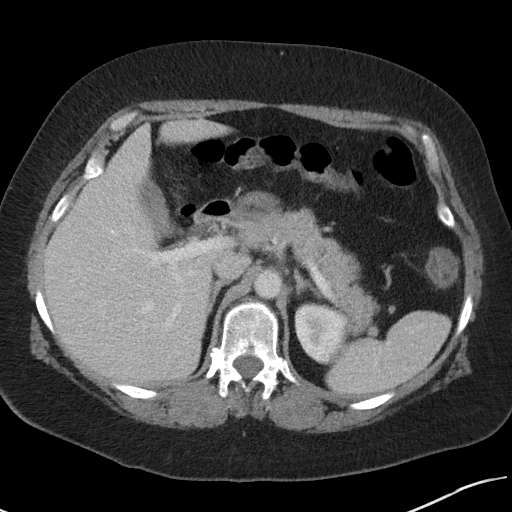
[im 85/99  soft-tissue]
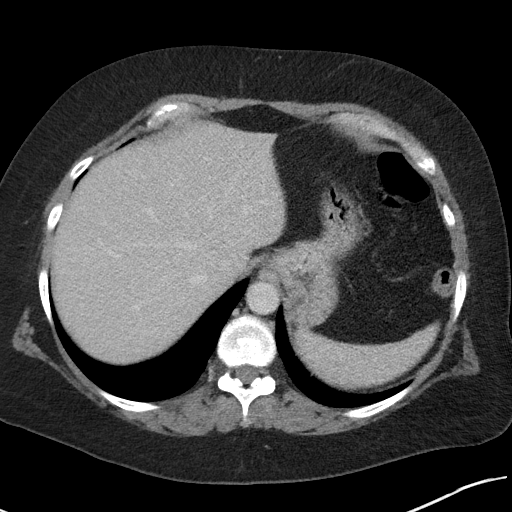
[im 94/99  soft-tissue]
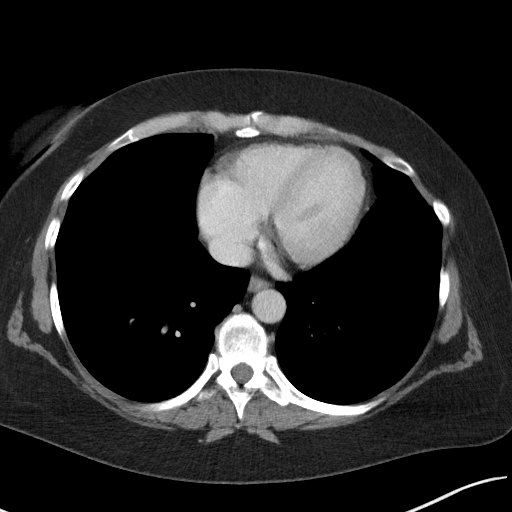

[Series 5: coronal st · coronal · 0.74mm/px · 3 of 75 slices shown]
[im 25/75  soft-tissue]
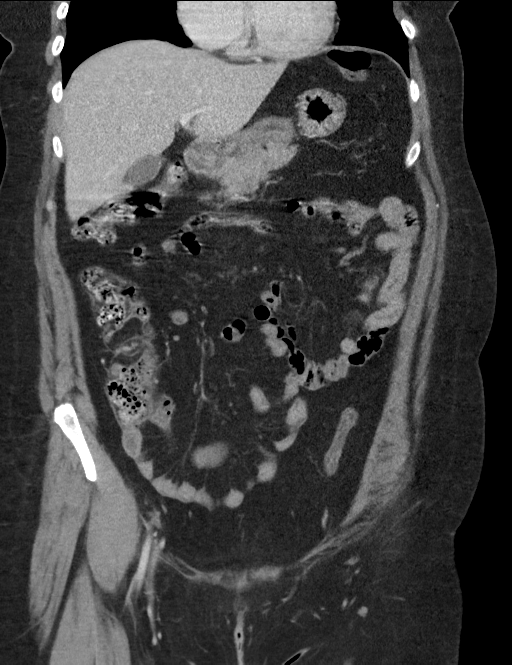
[im 33/75  soft-tissue]
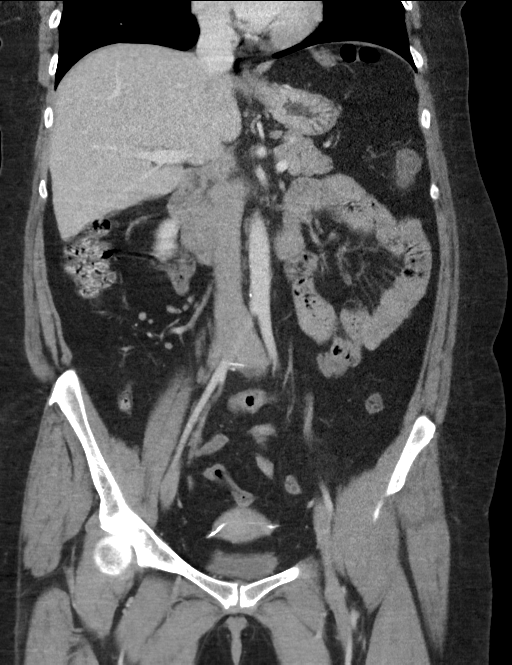
[im 42/75  soft-tissue]
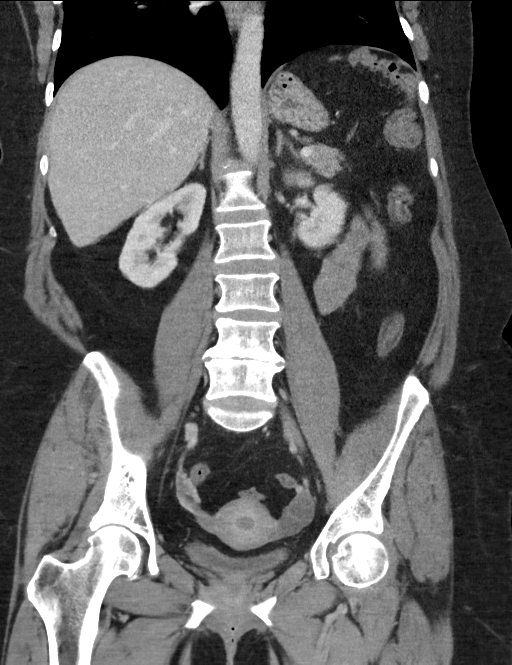

[16 of 46 positions shown; findings below may reference images not displayed]

FINDINGS: Lower chest: 5 mm subpleural ground-glass nodule in the right lower
lobe on image [DATE], likely a lymph node or focus of inflammation. No
suspicious pulmonary nodularity.

Hepatobiliary: The liver is normal in density without suspicious
focal abnormality. Punctate calcified gallstone. No evidence of
gallbladder wall thickening or biliary dilatation.

Pancreas: Unremarkable. No pancreatic ductal dilatation or
surrounding inflammatory changes.

Spleen: Normal in size without focal abnormality.

Adrenals/Urinary Tract: Both adrenal glands appear normal. The
kidneys appear normal without evidence of urinary tract calculus,
suspicious lesion or hydronephrosis. No bladder abnormalities are
seen.

Stomach/Bowel: No enteric contrast administered. The stomach appears
unremarkable for its degree of distension. No evidence of bowel wall
thickening, distention or surrounding inflammatory change. The
appendix appears normal. There is mild fat deposition within the
walls of the descending colon without surrounding inflammation.

Vascular/Lymphatic: There are no enlarged abdominal or pelvic lymph
nodes. Mild aortic and branch vessel atherosclerosis. No acute
vascular findings.

Reproductive: Essure IUD in place.  No suspicious adnexal findings.

Other: No evidence of abdominal wall mass or hernia. No ascites.

Musculoskeletal: No acute or significant osseous findings.
Postsurgical and degenerative changes at L4-5.
IMPRESSION: 1. No acute findings or explanation for the patient's symptoms. No
evidence of bowel obstruction, perforation or appendicitis.
2. Mild fat deposition within the descending colon without
surrounding inflammation.
3. Cholelithiasis without evidence of cholecystitis or biliary
dilatation.
4. Mild Aortic Atherosclerosis ([id number]).

## 2022-05-19 ENCOUNTER — Encounter (INDEPENDENT_AMBULATORY_CARE_PROVIDER_SITE_OTHER): Payer: Self-pay | Admitting: *Deleted

## 2022-05-27 IMAGING — US US ABDOMEN LIMITED
1 series · 14 of 25 positions shown · non-contrast
Comparison: June 29, 2021.

CLINICAL DATA: Abdominal pain.  Cholelithiasis.

EXAM:
ULTRASOUND ABDOMEN LIMITED RIGHT UPPER QUADRANT

[Series 1: us abdomen limited ruq (liver/gb) · 14 of 63 slices shown]
[im 1/63]
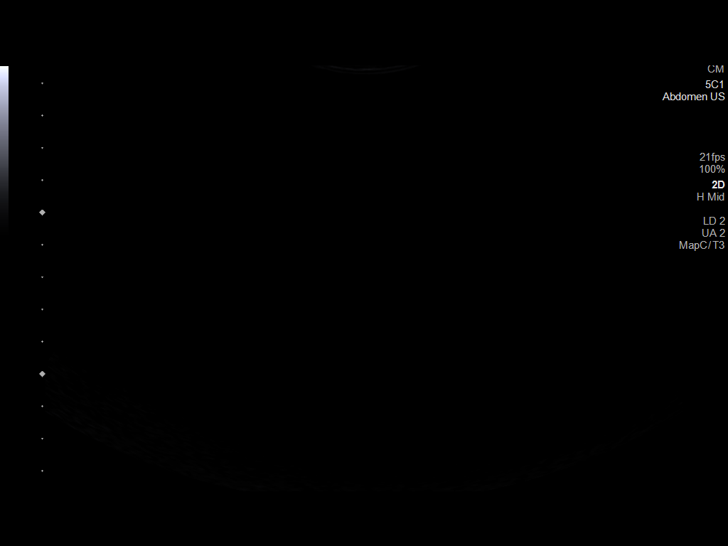
[im 6/63]
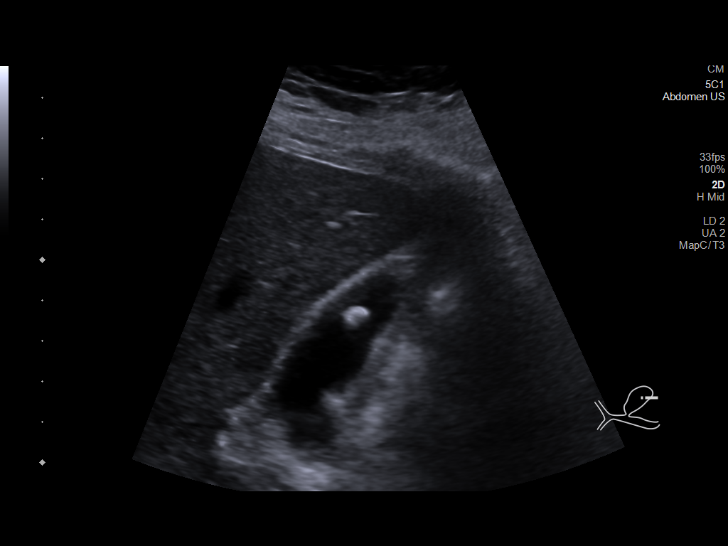
[im 11/63]
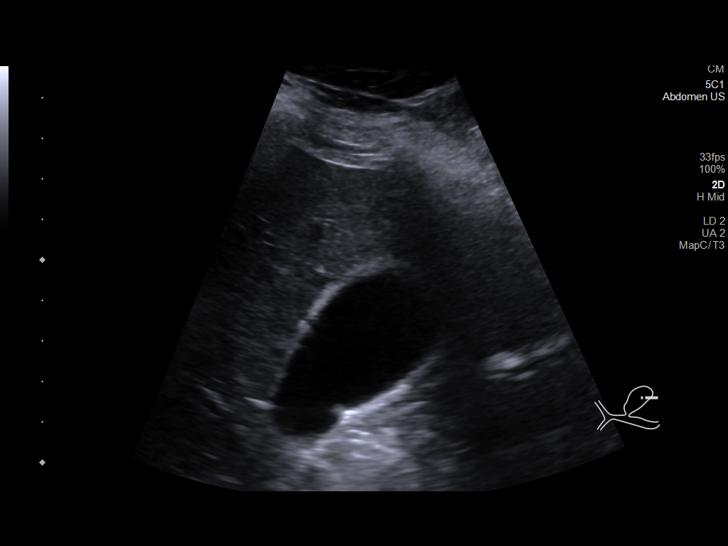
[im 16/63]
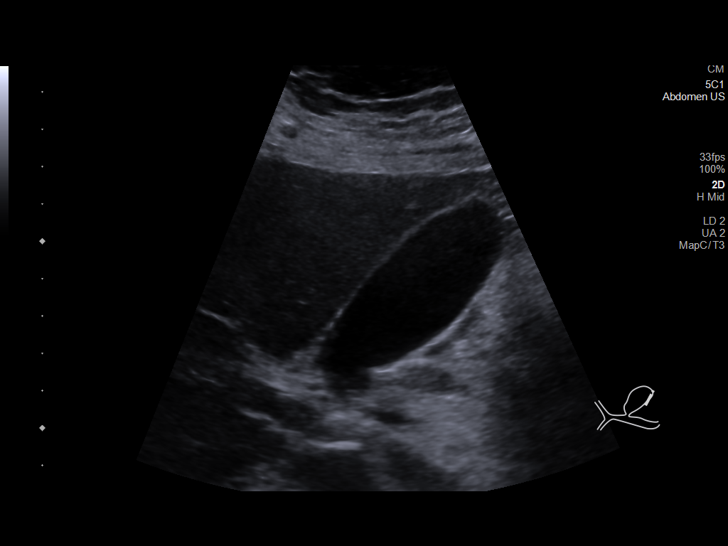
[im 21/63]
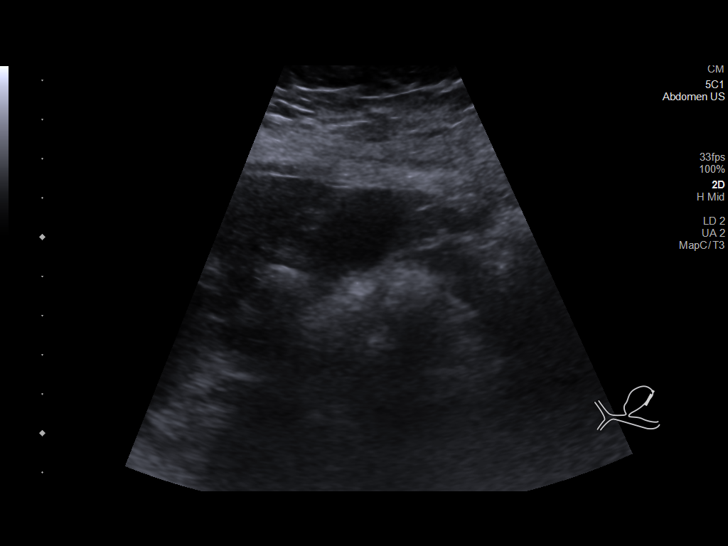
[im 24/63]
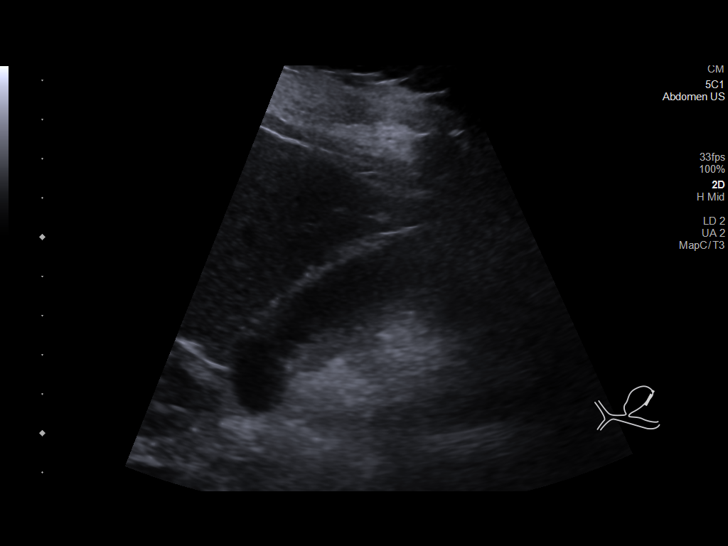
[im 29/63]
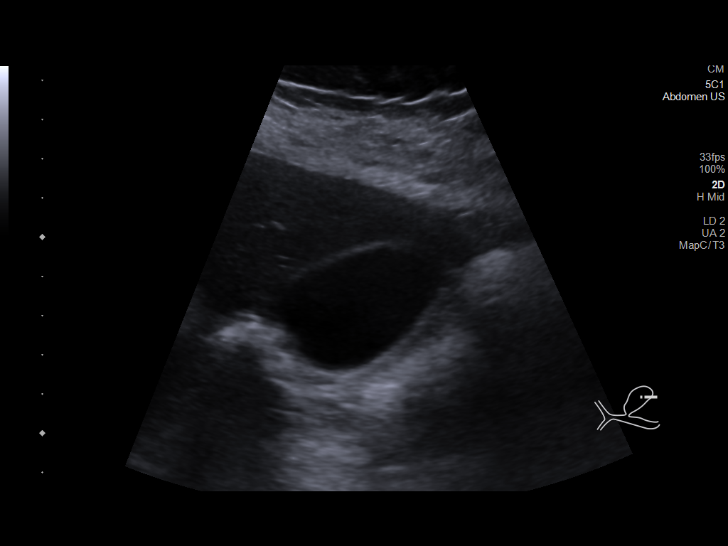
[im 34/63]
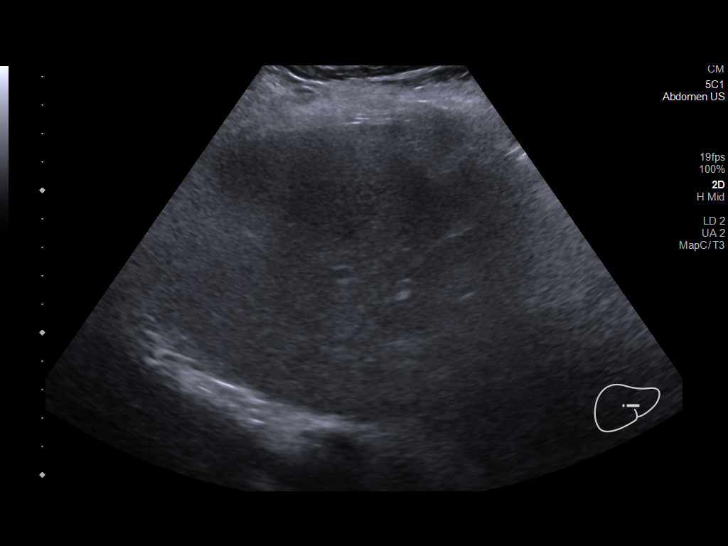
[im 39/63]
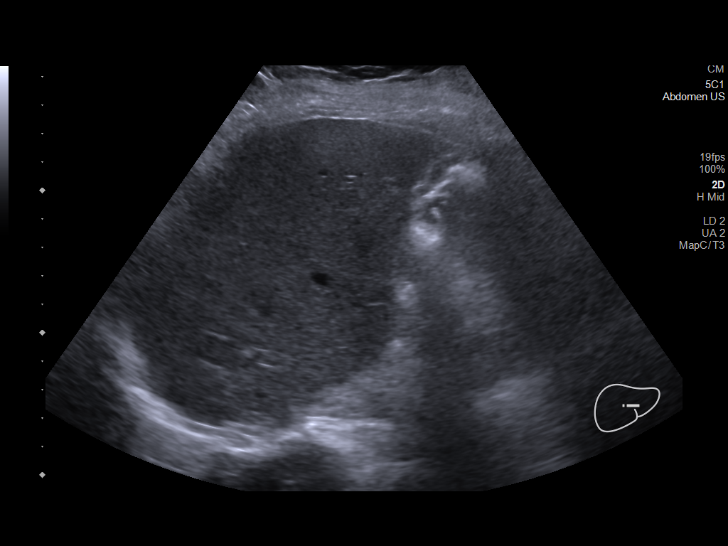
[im 42/63]
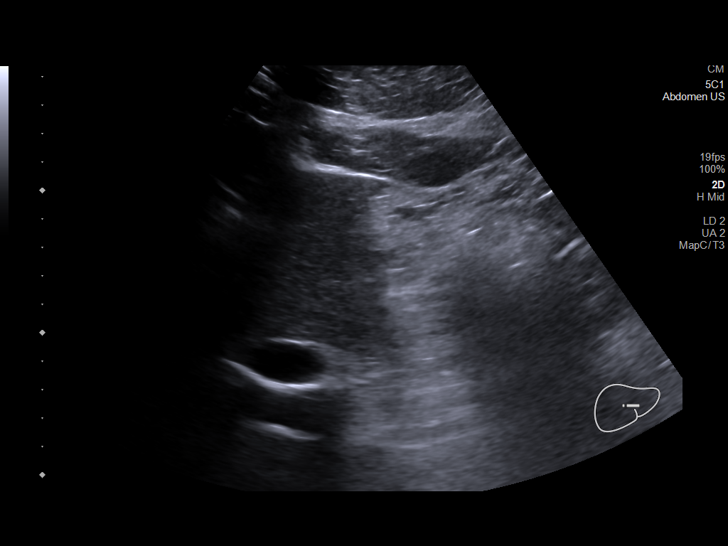
[im 47/63]
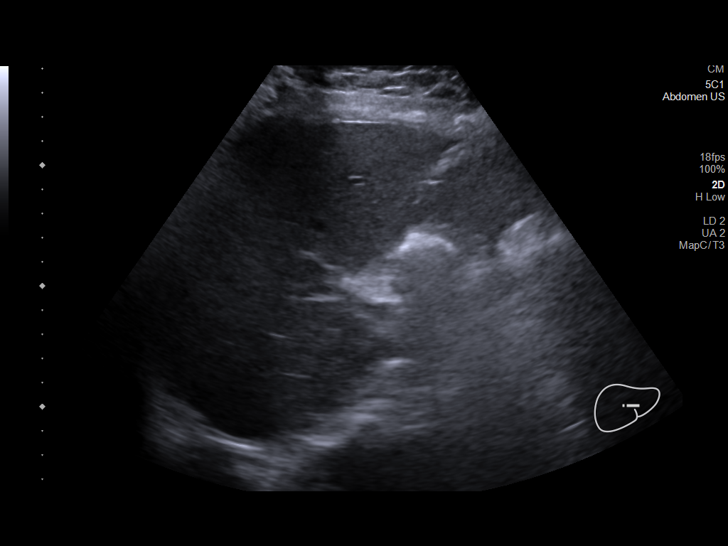
[im 52/63]
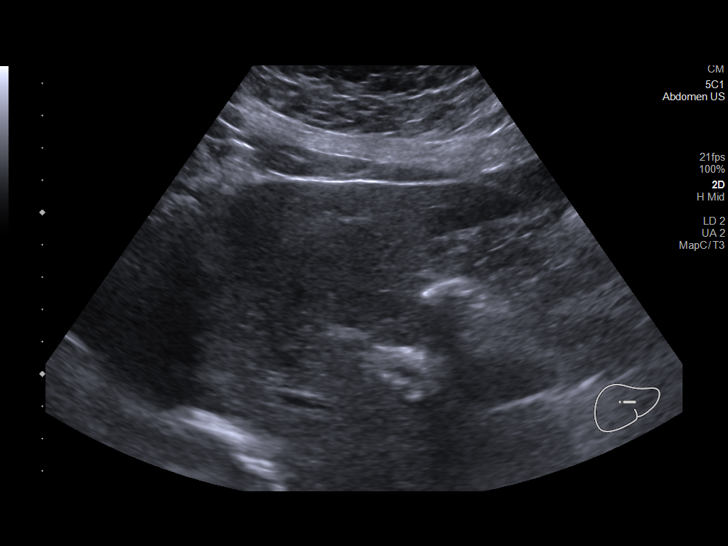
[im 57/63]
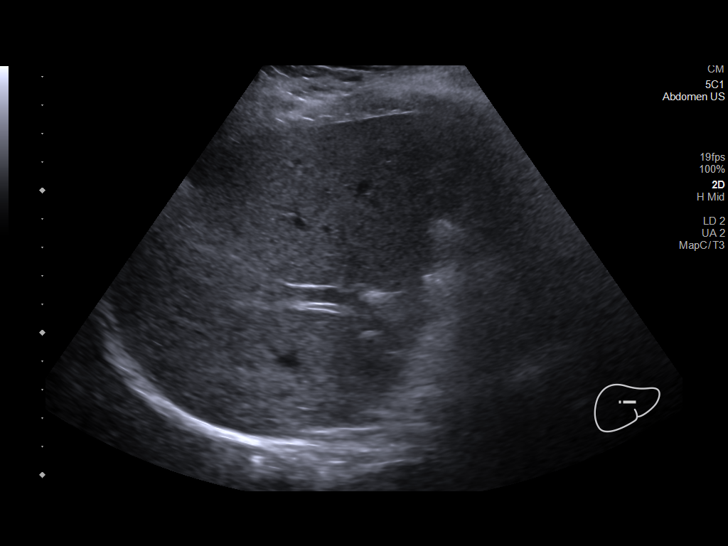
[im 63/63]
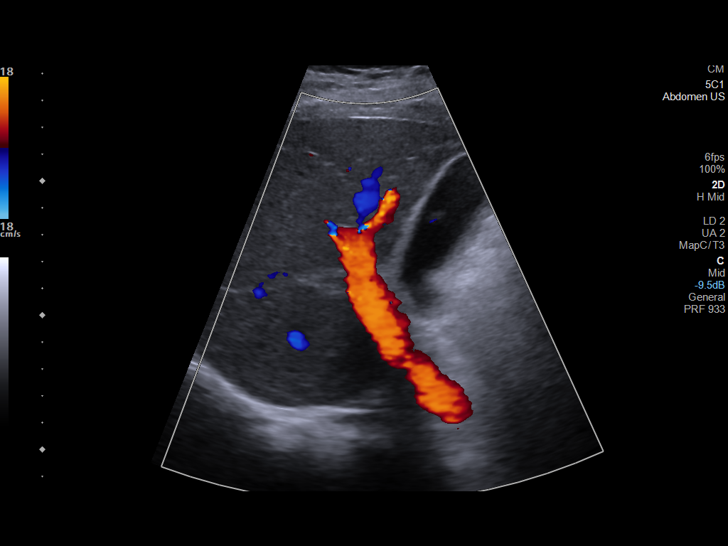

[14 of 25 positions shown; findings below may reference images not displayed]

FINDINGS: Gallbladder:

6 mm gallstone is noted. No gallbladder wall thickening or
pericholecystic fluid is noted. No sonographic Murphy's sign is
noted.

Common bile duct:

Diameter: 4 mm which is within normal limits.

Liver:

No focal lesion identified. Within normal limits in parenchymal
echogenicity. Portal vein is patent on color Doppler imaging with
normal direction of blood flow towards the liver.

Other: None.
IMPRESSION: Cholelithiasis without evidence of cholecystitis.

## 2022-11-01 ENCOUNTER — Encounter (INDEPENDENT_AMBULATORY_CARE_PROVIDER_SITE_OTHER): Payer: Self-pay | Admitting: *Deleted

## 2023-03-14 ENCOUNTER — Ambulatory Visit: Payer: No Typology Code available for payment source | Admitting: Adult Health

## 2023-03-21 ENCOUNTER — Ambulatory Visit: Payer: No Typology Code available for payment source | Admitting: Adult Health

## 2023-08-25 ENCOUNTER — Other Ambulatory Visit (HOSPITAL_COMMUNITY): Payer: Self-pay | Admitting: Family Medicine

## 2023-08-25 DIAGNOSIS — Z1231 Encounter for screening mammogram for malignant neoplasm of breast: Secondary | ICD-10-CM

## 2023-09-01 ENCOUNTER — Ambulatory Visit (HOSPITAL_COMMUNITY)
Admission: RE | Admit: 2023-09-01 | Discharge: 2023-09-01 | Disposition: A | Discharge status: Home / Self Care | Payer: 59 | Source: Ambulatory Visit | Attending: Family Medicine | Admitting: Family Medicine

## 2023-09-01 ENCOUNTER — Encounter (HOSPITAL_COMMUNITY): Payer: Self-pay

## 2023-09-01 DIAGNOSIS — Z1231 Encounter for screening mammogram for malignant neoplasm of breast: Secondary | ICD-10-CM | POA: Insufficient documentation

## 2023-09-05 ENCOUNTER — Encounter: Payer: Self-pay | Admitting: *Deleted

## 2023-09-12 ENCOUNTER — Other Ambulatory Visit (HOSPITAL_COMMUNITY): Payer: Self-pay | Admitting: Family Medicine

## 2023-09-12 ENCOUNTER — Inpatient Hospital Stay
Admission: RE | Admit: 2023-09-12 | Discharge: 2023-09-12 | Disposition: A | Discharge status: Home / Self Care | Payer: Self-pay | Source: Ambulatory Visit | Attending: Family Medicine | Admitting: Family Medicine

## 2023-09-12 DIAGNOSIS — Z1231 Encounter for screening mammogram for malignant neoplasm of breast: Secondary | ICD-10-CM

## 2024-03-06 ENCOUNTER — Encounter: Payer: Self-pay | Admitting: *Deleted

## 2024-05-02 ENCOUNTER — Other Ambulatory Visit: Payer: Self-pay

## 2024-05-02 ENCOUNTER — Encounter (HOSPITAL_COMMUNITY): Payer: Self-pay

## 2024-05-02 ENCOUNTER — Emergency Department (HOSPITAL_COMMUNITY)

## 2024-05-02 ENCOUNTER — Emergency Department (HOSPITAL_COMMUNITY)
Admission: EM | Admit: 2024-05-02 | Discharge: 2024-05-02 | Disposition: A | Discharge status: Home / Self Care | Attending: Emergency Medicine | Admitting: Emergency Medicine

## 2024-05-02 DIAGNOSIS — N939 Abnormal uterine and vaginal bleeding, unspecified: Secondary | ICD-10-CM | POA: Insufficient documentation

## 2024-05-02 LAB — CBC WITH DIFFERENTIAL/PLATELET
Abs Immature Granulocytes: 0.06 10*3/uL (ref 0.00–0.07)
Basophils Absolute: 0.1 10*3/uL (ref 0.0–0.1)
Basophils Relative: 1 %
Eosinophils Absolute: 0.3 10*3/uL (ref 0.0–0.5)
Eosinophils Relative: 3 %
HCT: 38.5 % (ref 36.0–46.0)
Hemoglobin: 12.6 g/dL (ref 12.0–15.0)
Immature Granulocytes: 1 %
Lymphocytes Relative: 32 %
Lymphs Abs: 2.9 10*3/uL (ref 0.7–4.0)
MCH: 31.5 pg (ref 26.0–34.0)
MCHC: 32.7 g/dL (ref 30.0–36.0)
MCV: 96.3 fL (ref 80.0–100.0)
Monocytes Absolute: 0.7 10*3/uL (ref 0.1–1.0)
Monocytes Relative: 8 %
Neutro Abs: 4.9 10*3/uL (ref 1.7–7.7)
Neutrophils Relative %: 55 %
Platelets: 357 10*3/uL (ref 150–400)
RBC: 4 MIL/uL (ref 3.87–5.11)
RDW: 13.8 % (ref 11.5–15.5)
WBC: 8.9 10*3/uL (ref 4.0–10.5)
nRBC: 0 % (ref 0.0–0.2)

## 2024-05-02 LAB — COMPREHENSIVE METABOLIC PANEL WITH GFR
ALT: 12 U/L (ref 0–44)
AST: 16 U/L (ref 15–41)
Albumin: 3.4 g/dL — ABNORMAL LOW (ref 3.5–5.0)
Alkaline Phosphatase: 72 U/L (ref 38–126)
Anion gap: 7 (ref 5–15)
BUN: 9 mg/dL (ref 6–20)
CO2: 23 mmol/L (ref 22–32)
Calcium: 8.5 mg/dL — ABNORMAL LOW (ref 8.9–10.3)
Chloride: 105 mmol/L (ref 98–111)
Creatinine, Ser: 0.75 mg/dL (ref 0.44–1.00)
GFR, Estimated: >60 mL/min (ref >60)
Glucose, Bld: 87 mg/dL (ref 70–99)
Potassium: 3.4 mmol/L — ABNORMAL LOW (ref 3.5–5.1)
Sodium: 135 mmol/L (ref 135–145)
Total Bilirubin: 0.6 mg/dL (ref 0.0–1.2)
Total Protein: 6.5 g/dL (ref 6.5–8.1)

## 2024-05-02 LAB — PREGNANCY, URINE: Preg Test, Ur: NEGATIVE

## 2024-05-02 LAB — URINALYSIS, ROUTINE W REFLEX MICROSCOPIC
Bacteria, UA: NONE SEEN
Bilirubin Urine: NEGATIVE
Glucose, UA: NEGATIVE mg/dL
Ketones, ur: NEGATIVE mg/dL
Nitrite: NEGATIVE
Protein, ur: 30 mg/dL — AB
RBC / HPF: >50 RBC/hpf (ref 0–5)
Specific Gravity, Urine: 1.015 (ref 1.005–1.030)
pH: 6 (ref 5.0–8.0)

## 2024-05-02 LAB — TYPE AND SCREEN
ABO/RH(D): O POS
Antibody Screen: NEGATIVE

## 2024-05-02 MED ORDER — MEGESTROL ACETATE 40 MG PO TABS: 40.0000 mg | ORAL_TABLET | Freq: Two times a day (BID) | ORAL | 1 refills | Status: AC

## 2024-05-02 NOTE — ED Notes (Signed)
 Pt reports she have gone through 3 packages of feminine pads due to the large amount of spotting and bleeding.

## 2024-05-02 NOTE — ED Provider Notes (Signed)
 [place] EMERGENCY DEPARTMENT AT [facility] Provider Note   CSN: [id number] Arrival date & time: 05/02/24  1005     History  Chief Complaint  Patient presents with   Vaginal Bleeding    Kelly Nicholson is a 53 y.o. female.   Vaginal Bleeding Associated symptoms: abdominal pain   Associated symptoms: no back pain, no dyspareunia, no fever and no nausea        Kelly Nicholson is a 53 y.o. female who presents to the Emergency Department complaining of general bleeding x 1 month.  She endorses heavy bleeding using at least 3-4 pads per day.  Passing multiple small clots.  She has been having intermittent crampy lower abdominal pain as well.  She had an appointment with her PCP today but decided to come to the ER for evaluation.  States she does not have a OB/GYN at this time.  She denies any fever, chills, nausea or vomiting no dysuria symptoms.  Home Medications Prior to Admission medications   Medication Sig Start Date End Date Taking? Authorizing Provider  venlafaxine  XR (EFFEXOR -XR) 75 MG 24 hr capsule Take 1 capsule (75 mg total) by mouth daily. 12/30/20   Javan Messing, NP      Allergies    Amoxicillin and Tramadol    Review of Systems   Review of Systems  Constitutional:  Negative for chills and fever.  Respiratory:  Negative for shortness of breath.   Cardiovascular:  Negative for chest pain.  Gastrointestinal:  Positive for abdominal pain. Negative for diarrhea, nausea and vomiting.  Genitourinary:  Positive for vaginal bleeding. Negative for difficulty urinating, dyspareunia and flank pain.  Musculoskeletal:  Negative for back pain.  Neurological:  Negative for syncope, weakness and headaches.    Physical Exam Updated Vital Signs BP 131/64 (BP Location: Right Arm)   Pulse (!) 58   Temp 97.9 F (36.6 C) (Oral)   Resp 17   Ht 5\' 6"  (1.676 m)   Wt 94.3 kg   SpO2 97%   BMI 33.55 kg/m  Physical Exam Vitals and nursing note reviewed. Exam  conducted with a chaperone present.  Constitutional:      General: She is not in acute distress.    Appearance: Normal appearance. She is not toxic-appearing.  Cardiovascular:     Rate and Rhythm: Normal rate and regular rhythm.     Pulses: Normal pulses.  Pulmonary:     Effort: Pulmonary effort is normal.  Abdominal:     General: There is no distension.     Palpations: Abdomen is soft.     Tenderness: There is no abdominal tenderness. There is no right CVA tenderness, left CVA tenderness or guarding.  Genitourinary:    Vagina: No foreign body. No tenderness or bleeding.     Cervix: Cervical bleeding present. No cervical motion tenderness or friability.     Adnexa:        Right: No mass or tenderness.         Left: No mass or tenderness.       Comments: Blood and small blood clots in the vaginal vault.  No CMT or palpable adnexal masses or tenderness. Musculoskeletal:        General: Normal range of motion.  Skin:    General: Skin is warm.     Capillary Refill: Capillary refill takes less than 2 seconds.  Neurological:     General: No focal deficit present.     Mental Status: She  is alert.     Motor: No weakness.     ED Results / Procedures / Treatments   Labs (all labs ordered are listed, but only abnormal results are displayed) Labs Reviewed  COMPREHENSIVE METABOLIC PANEL WITH GFR - Abnormal; Notable for the following components:      Result Value   Potassium 3.4 (*)    Calcium 8.5 (*)    Albumin 3.4 (*)    All other components within normal limits  URINALYSIS, ROUTINE W REFLEX MICROSCOPIC - Abnormal; Notable for the following components:   APPearance HAZY (*)    Hgb urine dipstick LARGE (*)    Protein, ur 30 (*)    Leukocytes,Ua TRACE (*)    All other components within normal limits  URINE CULTURE  CBC WITH DIFFERENTIAL/PLATELET  PREGNANCY, URINE  TYPE AND SCREEN    EKG None  Radiology US  Pelvis Complete Result Date: 05/02/2024 CLINICAL DATA:  Vaginal  bleeding x1 month, Essure IUD x 14 years EXAM: TRANSABDOMINAL ULTRASOUND OF PELVIS TECHNIQUE: Transabdominal ultrasound examination of the pelvis was performed including evaluation of the uterus, ovaries, adnexal regions, and pelvic cul-de-sac. COMPARISON:  CT 06/29/2021 FINDINGS: Uterus Measurements: 8.3 x 3.7 x 4.8 cm = volume: 76 mL. No fibroids or other mass visualized. Endometrium Thickness: 3.2 mm. Essure device in expected location, extending into the cornua. Right ovary Measurements: 2.7 x 1.4 x 1.6 cm = volume: 3.3 mL. Normal appearance/no adnexal mass. Left ovary Measurements: 2.9 x 1.4 x 1.7 cm = volume: 3.7 mL. Normal appearance/no adnexal mass. Other findings:  No abnormal free fluid. IMPRESSION: Normal pelvic ultrasound, post Essure deployment. Electronically Signed   By: Nicoletta Barrier M.D.   On: 05/02/2024 12:42    Procedures Procedures    Medications Ordered in ED Medications - No data to display  ED Course/ Medical Decision Making/ A&P                                 Medical Decision Making Patient here with 1 month history of vaginal bleeding.  Crampy abdominal pain intermittently.  Worse this morning.  Here for evaluation for the bleeding.  Endorses some generalized weakness as well.  No fevers or chills nausea or vomiting.  Dysfunctional uterine bleeding possibly related to menopausal symptoms, uterine fibroids, neoplasm, infectious process.  Amount and/or Complexity of Data Reviewed Labs: ordered.    Details: Labs, no evidence of leukocytosis, hemoglobin reassuring at 12.6.  Chemistries without significant derangement.  Urine pregnancy test is negative, urinalysis shows large amount of blood with trace leukocytes 21-50 white cells and no bacteria culture pending Radiology: ordered.    Details: Pelvic ultrasound performed without abnormal finding.  Patient does have Essure IUD Discussion of management or test interpretation with external provider(s): Persistent vaginal  bleeding in premenopausal patient.  Workup today without emergent finding  Discussed findings with OB/GYN provider, Dr. Willey Harrier who recommends Megace 40 mg twice a day and patient to [facility]  Patient agreeable to plan.  Appears appropriate for discharge at this time, all questions were answered.  Risk Prescription drug management.           Final Clinical Impression(s) / ED Diagnoses Final diagnoses:  Abnormal vaginal bleeding    Rx / DC Orders ED Discharge Orders     None         Catherne Clubs, PA-C 05/02/24 1344    Jerilynn Montenegro, MD 05/03/24 [phone]

## 2024-05-02 NOTE — ED Notes (Signed)
UA taken to lab.

## 2024-05-02 NOTE — ED Triage Notes (Signed)
 Pt arrived via POV c/o vaginal bleeding X 1 month. Pt reports she had a PCP appointment today but did not feel like she could make it. Pt reports seeing clots. Pt reports she does not have an OB doctor to see.

## 2024-05-02 NOTE — ED Notes (Signed)
 Patient stated that she has been bleeding for 3 weeks straight. She had a procedure done 14 years ago and should not be pregnant. She does still get her periods.

## 2024-05-02 NOTE — Discharge Instructions (Addendum)
 Take the prescription as directed, please call [facility] to arrange follow-up appointment.  Return to emergency department if you develop any new or worsening symptoms.

## 2024-05-04 LAB — URINE CULTURE

## 2024-05-05 ENCOUNTER — Telehealth (HOSPITAL_BASED_OUTPATIENT_CLINIC_OR_DEPARTMENT_OTHER): Payer: Self-pay | Admitting: *Deleted

## 2024-05-05 NOTE — Telephone Encounter (Signed)
 Post ED Visit - Positive Culture Follow-up  Culture report reviewed by antimicrobial stewardship pharmacist: [facility] [address] Pharm.D. []  Skeet Duke, Pharm.D., BCPS AQ-ID []  Leslee Rase, Pharm.D., BCPS []  Garland Junk, Pharm.D., BCPS []  [place], [address]D., BCPS, AAHIVP []  Alcide Aly, Pharm.D., BCPS, AAHIVP []  Jerri Morale, PharmD, BCPS []  Graham Laws, PharmD, BCPS []  Cleda Curly, PharmD, BCPS []  Tamar Fairly, PharmD []  Ballard Levels, PharmD, BCPS i[x] Dionicio Fray, PharmD  [facility] []  Arlyne Bering, PharmD []  Sherryle Don, PharmD []  Van Gelinas, PharmD []  Delila Felty, Rph []  [place]) Cleora Daft, PharmD []  Augustina Block, PharmD []  Arie Kurtz, PharmD []  Sharlyn Deaner, PharmD []  Agnes Hose, PharmD []  Kendall Pauls, PharmD []  Gladstone Lamer, PharmD []  Armanda Bern, PharmD []  Tera Fellows, PharmD   Positive urine culture No further patient follow-up is required at this time.  Georgine Kitchens 05/05/2024, 12:48 PM
